# Patient Record
Sex: Female | Born: 1975 | Race: White | Hispanic: No | Smoking: Current every day smoker
Health system: Southern US, Community
[De-identification: ages and names within clinical notes are randomized; demographics above are authoritative.]

## PROBLEM LIST (undated history)

## (undated) DIAGNOSIS — K589 Irritable bowel syndrome without diarrhea: Secondary | ICD-10-CM

## (undated) HISTORY — PX: CHOLECYSTECTOMY: SHX55

---
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
## 1998-08-01 ENCOUNTER — Emergency Department (HOSPITAL_COMMUNITY): Admission: EM | Admit: 1998-08-01 | Discharge: 1998-08-01 | Payer: Self-pay | Admitting: Emergency Medicine

## 1999-10-18 ENCOUNTER — Other Ambulatory Visit: Admission: RE | Admit: 1999-10-18 | Discharge: 1999-10-18 | Payer: Self-pay | Admitting: Obstetrics and Gynecology

## 1999-10-23 ENCOUNTER — Emergency Department (HOSPITAL_COMMUNITY): Admission: EM | Admit: 1999-10-23 | Discharge: 1999-10-23 | Payer: Self-pay | Admitting: Emergency Medicine

## 1999-11-17 ENCOUNTER — Encounter: Payer: Self-pay | Admitting: Obstetrics and Gynecology

## 1999-11-17 ENCOUNTER — Ambulatory Visit (HOSPITAL_COMMUNITY): Admission: RE | Admit: 1999-11-17 | Discharge: 1999-11-17 | Payer: Self-pay | Admitting: Obstetrics and Gynecology

## 1999-12-15 ENCOUNTER — Encounter: Payer: Self-pay | Admitting: Obstetrics and Gynecology

## 1999-12-15 ENCOUNTER — Ambulatory Visit (HOSPITAL_COMMUNITY): Admission: RE | Admit: 1999-12-15 | Discharge: 1999-12-15 | Payer: Self-pay | Admitting: Obstetrics and Gynecology

## 2000-02-09 ENCOUNTER — Encounter: Admission: RE | Admit: 2000-02-09 | Discharge: 2000-05-09 | Payer: Self-pay | Admitting: Gynecology

## 2000-02-18 ENCOUNTER — Inpatient Hospital Stay (HOSPITAL_COMMUNITY): Admission: AD | Admit: 2000-02-18 | Discharge: 2000-02-18 | Payer: Self-pay | Admitting: Obstetrics and Gynecology

## 2000-02-29 ENCOUNTER — Ambulatory Visit (HOSPITAL_COMMUNITY): Admission: RE | Admit: 2000-02-29 | Discharge: 2000-02-29 | Payer: Self-pay | Admitting: Obstetrics and Gynecology

## 2000-02-29 ENCOUNTER — Encounter: Payer: Self-pay | Admitting: Obstetrics and Gynecology

## 2000-03-08 ENCOUNTER — Inpatient Hospital Stay (HOSPITAL_COMMUNITY): Admission: AD | Admit: 2000-03-08 | Discharge: 2000-03-08 | Payer: Self-pay | Admitting: Obstetrics and Gynecology

## 2000-03-29 ENCOUNTER — Inpatient Hospital Stay (HOSPITAL_COMMUNITY): Admission: AD | Admit: 2000-03-29 | Discharge: 2000-04-02 | Payer: Self-pay | Admitting: Obstetrics and Gynecology

## 2000-03-29 ENCOUNTER — Encounter: Payer: Self-pay | Admitting: Obstetrics and Gynecology

## 2000-03-29 ENCOUNTER — Ambulatory Visit (HOSPITAL_COMMUNITY): Admission: RE | Admit: 2000-03-29 | Discharge: 2000-03-29 | Payer: Self-pay | Admitting: Obstetrics and Gynecology

## 2001-02-26 ENCOUNTER — Other Ambulatory Visit: Admission: RE | Admit: 2001-02-26 | Discharge: 2001-02-26 | Payer: Self-pay | Admitting: Obstetrics and Gynecology

## 2002-01-22 ENCOUNTER — Emergency Department (HOSPITAL_COMMUNITY): Admission: EM | Admit: 2002-01-22 | Discharge: 2002-01-22 | Payer: Self-pay | Admitting: Emergency Medicine

## 2002-04-30 ENCOUNTER — Ambulatory Visit (HOSPITAL_COMMUNITY): Admission: RE | Admit: 2002-04-30 | Discharge: 2002-04-30 | Payer: Self-pay | Admitting: Internal Medicine

## 2002-04-30 ENCOUNTER — Encounter: Payer: Self-pay | Admitting: Internal Medicine

## 2003-03-09 ENCOUNTER — Other Ambulatory Visit: Admission: RE | Admit: 2003-03-09 | Discharge: 2003-03-09 | Payer: Self-pay | Admitting: Obstetrics and Gynecology

## 2004-04-25 ENCOUNTER — Emergency Department (HOSPITAL_COMMUNITY): Admission: EM | Admit: 2004-04-25 | Discharge: 2004-04-25 | Payer: Self-pay | Admitting: Emergency Medicine

## 2004-04-26 ENCOUNTER — Other Ambulatory Visit: Admission: RE | Admit: 2004-04-26 | Discharge: 2004-04-26 | Payer: Self-pay | Admitting: Obstetrics and Gynecology

## 2004-08-24 ENCOUNTER — Encounter: Admission: RE | Admit: 2004-08-24 | Discharge: 2004-08-24 | Payer: Self-pay | Admitting: Family Medicine

## 2004-09-07 ENCOUNTER — Encounter: Admission: RE | Admit: 2004-09-07 | Discharge: 2004-09-07 | Payer: Self-pay | Admitting: Family Medicine

## 2004-10-31 ENCOUNTER — Other Ambulatory Visit: Admission: RE | Admit: 2004-10-31 | Discharge: 2004-10-31 | Payer: Self-pay | Admitting: Obstetrics and Gynecology

## 2004-12-05 ENCOUNTER — Encounter: Admission: RE | Admit: 2004-12-05 | Discharge: 2005-03-05 | Payer: Self-pay | Admitting: Obstetrics and Gynecology

## 2004-12-11 ENCOUNTER — Ambulatory Visit (HOSPITAL_COMMUNITY): Admission: RE | Admit: 2004-12-11 | Discharge: 2004-12-11 | Payer: Self-pay | Admitting: Obstetrics and Gynecology

## 2005-01-29 ENCOUNTER — Inpatient Hospital Stay (HOSPITAL_COMMUNITY): Admission: AD | Admit: 2005-01-29 | Discharge: 2005-01-29 | Payer: Self-pay | Admitting: Obstetrics and Gynecology

## 2005-04-08 ENCOUNTER — Inpatient Hospital Stay (HOSPITAL_COMMUNITY): Admission: AD | Admit: 2005-04-08 | Discharge: 2005-04-11 | Payer: Self-pay | Admitting: Obstetrics and Gynecology

## 2005-04-08 ENCOUNTER — Encounter (INDEPENDENT_AMBULATORY_CARE_PROVIDER_SITE_OTHER): Payer: Self-pay | Admitting: Specialist

## 2005-04-12 ENCOUNTER — Encounter: Admission: RE | Admit: 2005-04-12 | Discharge: 2005-05-11 | Payer: Self-pay | Admitting: Obstetrics and Gynecology

## 2005-05-12 ENCOUNTER — Encounter: Admission: RE | Admit: 2005-05-12 | Discharge: 2005-06-11 | Payer: Self-pay | Admitting: Obstetrics and Gynecology

## 2005-06-11 ENCOUNTER — Ambulatory Visit (HOSPITAL_COMMUNITY): Admission: RE | Admit: 2005-06-11 | Discharge: 2005-06-11 | Payer: Self-pay | Admitting: Obstetrics and Gynecology

## 2005-06-12 ENCOUNTER — Encounter: Admission: RE | Admit: 2005-06-12 | Discharge: 2005-07-11 | Payer: Self-pay | Admitting: Obstetrics and Gynecology

## 2005-07-12 ENCOUNTER — Encounter: Admission: RE | Admit: 2005-07-12 | Discharge: 2005-08-11 | Payer: Self-pay | Admitting: Obstetrics and Gynecology

## 2005-08-12 ENCOUNTER — Encounter: Admission: RE | Admit: 2005-08-12 | Discharge: 2005-09-11 | Payer: Self-pay | Admitting: Obstetrics and Gynecology

## 2005-09-12 ENCOUNTER — Encounter: Admission: RE | Admit: 2005-09-12 | Discharge: 2005-10-09 | Payer: Self-pay | Admitting: Obstetrics and Gynecology

## 2005-10-10 ENCOUNTER — Encounter: Admission: RE | Admit: 2005-10-10 | Discharge: 2005-11-09 | Payer: Self-pay | Admitting: Obstetrics and Gynecology

## 2005-10-31 ENCOUNTER — Ambulatory Visit: Payer: Self-pay | Admitting: *Deleted

## 2005-10-31 ENCOUNTER — Ambulatory Visit: Payer: Self-pay | Admitting: Family Medicine

## 2005-11-09 ENCOUNTER — Ambulatory Visit: Payer: Self-pay | Admitting: Family Medicine

## 2005-11-10 ENCOUNTER — Encounter: Admission: RE | Admit: 2005-11-10 | Discharge: 2005-12-09 | Payer: Self-pay | Admitting: Obstetrics and Gynecology

## 2005-11-30 ENCOUNTER — Ambulatory Visit: Payer: Self-pay | Admitting: Family Medicine

## 2005-12-10 ENCOUNTER — Encounter: Admission: RE | Admit: 2005-12-10 | Discharge: 2006-01-09 | Payer: Self-pay | Admitting: Obstetrics and Gynecology

## 2006-01-10 ENCOUNTER — Encounter: Admission: RE | Admit: 2006-01-10 | Discharge: 2006-02-08 | Payer: Self-pay | Admitting: Obstetrics and Gynecology

## 2006-02-09 ENCOUNTER — Encounter: Admission: RE | Admit: 2006-02-09 | Discharge: 2006-03-11 | Payer: Self-pay | Admitting: Obstetrics and Gynecology

## 2006-02-12 ENCOUNTER — Ambulatory Visit: Payer: Self-pay | Admitting: Family Medicine

## 2006-03-12 ENCOUNTER — Encounter: Admission: RE | Admit: 2006-03-12 | Discharge: 2006-04-11 | Payer: Self-pay | Admitting: Obstetrics and Gynecology

## 2006-03-13 ENCOUNTER — Ambulatory Visit: Payer: Self-pay | Admitting: Nurse Practitioner

## 2006-04-12 ENCOUNTER — Encounter: Admission: RE | Admit: 2006-04-12 | Discharge: 2006-05-11 | Payer: Self-pay | Admitting: Obstetrics and Gynecology

## 2006-05-12 ENCOUNTER — Encounter: Admission: RE | Admit: 2006-05-12 | Discharge: 2006-06-11 | Payer: Self-pay | Admitting: Obstetrics and Gynecology

## 2006-06-12 ENCOUNTER — Encounter: Admission: RE | Admit: 2006-06-12 | Discharge: 2006-07-11 | Payer: Self-pay | Admitting: Obstetrics and Gynecology

## 2006-07-12 ENCOUNTER — Encounter: Admission: RE | Admit: 2006-07-12 | Discharge: 2006-08-11 | Payer: Self-pay | Admitting: Obstetrics and Gynecology

## 2006-08-12 ENCOUNTER — Encounter: Admission: RE | Admit: 2006-08-12 | Discharge: 2006-09-11 | Payer: Self-pay | Admitting: Obstetrics and Gynecology

## 2006-09-12 ENCOUNTER — Encounter: Admission: RE | Admit: 2006-09-12 | Discharge: 2006-10-11 | Payer: Self-pay | Admitting: Obstetrics and Gynecology

## 2007-01-28 ENCOUNTER — Other Ambulatory Visit: Payer: Self-pay

## 2007-01-28 ENCOUNTER — Other Ambulatory Visit: Payer: Self-pay | Admitting: Emergency Medicine

## 2007-01-28 ENCOUNTER — Inpatient Hospital Stay (HOSPITAL_COMMUNITY): Admission: AD | Admit: 2007-01-28 | Discharge: 2007-02-02 | Payer: Self-pay | Admitting: *Deleted

## 2007-01-28 ENCOUNTER — Ambulatory Visit: Payer: Self-pay | Admitting: *Deleted

## 2007-03-04 ENCOUNTER — Emergency Department (HOSPITAL_COMMUNITY): Admission: EM | Admit: 2007-03-04 | Discharge: 2007-03-04 | Payer: Self-pay | Admitting: Emergency Medicine

## 2007-11-22 ENCOUNTER — Inpatient Hospital Stay (HOSPITAL_COMMUNITY): Admission: AD | Admit: 2007-11-22 | Discharge: 2007-11-27 | Payer: Self-pay | Admitting: Psychiatry

## 2007-11-22 ENCOUNTER — Ambulatory Visit: Payer: Self-pay | Admitting: Psychiatry

## 2008-05-18 ENCOUNTER — Emergency Department (HOSPITAL_COMMUNITY): Admission: EM | Admit: 2008-05-18 | Discharge: 2008-05-18 | Payer: Self-pay | Admitting: Emergency Medicine

## 2008-06-13 ENCOUNTER — Emergency Department (HOSPITAL_COMMUNITY): Admission: EM | Admit: 2008-06-13 | Discharge: 2008-06-13 | Payer: Self-pay | Admitting: Emergency Medicine

## 2009-11-22 ENCOUNTER — Emergency Department (HOSPITAL_COMMUNITY): Admission: EM | Admit: 2009-11-22 | Discharge: 2009-11-22 | Payer: Self-pay | Admitting: Emergency Medicine

## 2010-05-14 ENCOUNTER — Emergency Department (HOSPITAL_COMMUNITY)
Admission: EM | Admit: 2010-05-14 | Discharge: 2010-05-15 | Payer: Self-pay | Source: Home / Self Care | Admitting: Emergency Medicine

## 2010-05-23 ENCOUNTER — Emergency Department (HOSPITAL_COMMUNITY): Admission: EM | Admit: 2010-05-23 | Discharge: 2010-05-23 | Payer: Self-pay | Admitting: Emergency Medicine

## 2010-08-13 ENCOUNTER — Encounter: Payer: Self-pay | Admitting: Obstetrics and Gynecology

## 2010-08-21 ENCOUNTER — Emergency Department (HOSPITAL_COMMUNITY)
Admission: EM | Admit: 2010-08-21 | Discharge: 2010-08-21 | Payer: Self-pay | Source: Home / Self Care | Admitting: Emergency Medicine

## 2010-08-25 ENCOUNTER — Emergency Department (HOSPITAL_COMMUNITY)
Admission: EM | Admit: 2010-08-25 | Discharge: 2010-08-25 | Disposition: A | Discharge status: Home / Self Care | Payer: BC Managed Care – PPO | Attending: Emergency Medicine | Admitting: Emergency Medicine

## 2010-08-25 DIAGNOSIS — G8929 Other chronic pain: Secondary | ICD-10-CM | POA: Insufficient documentation

## 2010-08-25 DIAGNOSIS — K589 Irritable bowel syndrome without diarrhea: Secondary | ICD-10-CM | POA: Insufficient documentation

## 2010-08-25 DIAGNOSIS — E669 Obesity, unspecified: Secondary | ICD-10-CM | POA: Insufficient documentation

## 2010-08-25 DIAGNOSIS — R1013 Epigastric pain: Secondary | ICD-10-CM | POA: Insufficient documentation

## 2010-08-25 DIAGNOSIS — J45909 Unspecified asthma, uncomplicated: Secondary | ICD-10-CM | POA: Insufficient documentation

## 2010-08-25 DIAGNOSIS — K625 Hemorrhage of anus and rectum: Secondary | ICD-10-CM | POA: Insufficient documentation

## 2010-08-25 DIAGNOSIS — M549 Dorsalgia, unspecified: Secondary | ICD-10-CM | POA: Insufficient documentation

## 2010-08-25 DIAGNOSIS — K921 Melena: Secondary | ICD-10-CM | POA: Insufficient documentation

## 2010-08-25 LAB — BASIC METABOLIC PANEL
CO2: 25 mEq/L (ref 19–32)
Chloride: 105 mEq/L (ref 96–112)
Potassium: 3.6 mEq/L (ref 3.5–5.1)

## 2010-08-25 LAB — POCT I-STAT, CHEM 8
BUN: 8 mg/dL (ref 6–23)
Chloride: 103 mEq/L (ref 96–112)
Hemoglobin: 15.6 g/dL — ABNORMAL HIGH (ref 12.0–15.0)
Sodium: 139 mEq/L (ref 135–145)

## 2010-09-14 ENCOUNTER — Other Ambulatory Visit: Payer: Self-pay | Admitting: Gastroenterology

## 2010-09-15 ENCOUNTER — Other Ambulatory Visit: Payer: Self-pay | Admitting: Gastroenterology

## 2010-09-19 ENCOUNTER — Ambulatory Visit
Admission: RE | Admit: 2010-09-19 | Discharge: 2010-09-19 | Disposition: A | Discharge status: Home / Self Care | Payer: BC Managed Care – PPO | Source: Ambulatory Visit | Attending: Gastroenterology | Admitting: Gastroenterology
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
## 2010-12-08 NOTE — Discharge Summary (Signed)
NAMECORTNE, [name]                 ACCOUNT NO.:  [id number]   MEDICAL RECORD NO.:  [id number]          PATIENT TYPE:  INP   LOCATION:  9129                          FACILITY:  [facility]   PHYSICIAN:  Zenaida Niece, M.D.DATE OF BIRTH:  12-18-1975   DATE OF ADMISSION:  04/08/2005  DATE OF DISCHARGE:  04/11/2005                                 DISCHARGE SUMMARY   ADMISSION DIAGNOSES:  1.  Intrauterine pregnancy at 36+ weeks.  2.  Previous cesarean section x2.  3.  Acute vaginal bleeding.  4.  Class A1 gestational diabetes.   DISCHARGE DIAGNOSIS:  1.  Intrauterine pregnancy at 36+ weeks.  2.  Previous cesarean section x2.  3.  Acute vaginal bleeding.  4.  Class A1 gestational diabetes.   PROCEDURES:  On April 08, 2005, she had a stat repeat low transverse  cesarean section.   HISTORY AND PHYSICAL:  This is a 36 year old white female gravida 3, para 2-  0-0-2 with an EGA of [redacted] weeks who presents with complaint of brisk vaginal  bleeding and abdominal pain with contractions. Evaluation in triage revealed  a fetal heart tracing in the 90s to 100s with vaginal bleeding and she was  taken to the operating for a stat cesarean section. Prenatal care  complicated by class A1 gestational diabetes with good control with diet,  asthma well controlled with medications, epigastric pain at 27 weeks  diagnosed with cholelithiasis. She was scheduled for an amniocentesis and  repeat cesarean section next week.   PRENATAL LABS:  Blood type is AB positive with a negative antibody screen,  RPR nonreactive, rubella immune, hepatitis B surface antigen negative, HIV  negative, gonorrhea and chlamydia negative. A 1-hour Glucola 159. A 3-hour  GTT 95, 186, 166, 81. A group B strep is positive.   PAST OBSTETRICAL HISTORY:  In 1996 cesarean section at 40 weeks, 7 pounds 13  ounces in 2001. Repeat cesarean section at 40 weeks, 8 pounds 5 ounces  complicated by gestational diabetes.   PAST MEDICAL  HISTORY:  Asthma and irritable bowel syndrome.   MEDICATIONS:  Singulair and Advair.   PHYSICAL EXAM:  She is afebrile with stable vital signs. Fetal heart tracing  is in the 90s to 100s. Abdomen is gravid and tender. Vaginal exam reveals  gross blood and cervical exam is deferred.   HOSPITAL COURSE:  The patient was admitted and taken to the operating room  for stat cesarean section. This is a repeat low transverse cesarean section  done under general anesthesia. She delivered a viable female infant with  Apgar's of 4, 8 and 10. Weight 5 pounds 12 ounces and had an arterial cord  pH of 7.01. Estimated blood loss was 1000 cc and there was approximately 200  cc of clot in the uterus consistent with a possible abruption.  Postoperatively, she had no significant complications. Predelivery  hemoglobin 12.5 and postdelivery was 8.4. She did have a sore throat with a  negative throat swab for group A strep. On postoperative day #3, she was  felt to be stable enough for discharge home.  DISCHARGE INSTRUCTIONS:  Regular diet, pelvic rest and no strenuous  activity. Follow-up is in approximately 2 days for staple removal.   MEDICATIONS:  Percocet #30 one to two p.o. every 4-6 hours p.r.n. pain and  she is given our discharge pamphlet.      Zenaida Niece, M.D.  Electronically Signed     TDM/MEDQ  D:  05/12/2005  T:  05/12/2005  Job:  [id number]

## 2010-12-08 NOTE — H&P (Signed)
[name], [name]                 ACCOUNT NO.:  [id number]   MEDICAL RECORD NO.:  [id number]          PATIENT TYPE:  AMB   LOCATION:  SDC                           [facility]ILITY:  [facility]   PHYSICIAN:  Zenaida Niece, M.D.DATE OF BIRTH:  1976/01/09   DATE OF ADMISSION:  06/11/2005  DATE OF DISCHARGE:                                HISTORY & PHYSICAL   CHIEF COMPLAINT:  Desires surgical sterility.   HISTORY OF PRESENT ILLNESS:  This is a 35 year old white female, para 3-0-0-  3, who is status post a stat repeat cesarean section on April 08, 2005  who desires surgical sterility.  All options for contraception have been  discussed with her, and she wishes to proceed with surgical therapy for  sterility.   PAST OBSTETRICAL HISTORY:  Significant for 3 cesarean sections, again the  last one being on April 08, 2005 at 36 weeks that was a stat cesarean  section for an abruption.  She does have a history of gestational diabetes  with 2 of those pregnancies.   PAST MEDICAL HISTORY:  1.  Asthma.  2.  Irritable bowel syndrome.  3.  Remote history of pyelonephritis.  4.  Anxiety and depression.   PAST SURGICAL HISTORY:  Cesarean section x3.   SOCIAL HISTORY:  The patient is married, and she does smoke about a half  pack of cigarettes a day.   ALLERGIES:  None known.   CURRENT MEDICATIONS:  1.  Lexapro for anxiety and depression.  2.  Singulair and Advair for her asthma.   REVIEW OF SYSTEMS:  Otherwise negative.   PHYSICAL EXAMINATION:  VITAL SIGNS:  Weight is approximately 240 pounds.  The last blood pressure was 130/68.  GENERAL:  This is a slightly obese white female in no acute distress.  NECK:  Supple without lymphadenopathy or thyromegaly.  LUNGS:  Clear to auscultation.  HEART:  Regular rate and rhythm without murmur.  ABDOMEN:  Obese but soft and nontender, without palpable masses, and with a  well-healed transverse scar.  EXTREMITIES:  No significant edema and are  nontender.  PELVIC:  No lesions.  On speculum exam, the cervix was normal.  On bimanual  exam, I am unable to palpate her uterus due to her body habitus, but there  are no palpable masses.   ASSESSMENT:  Desires surgical sterility.  All non-surgical and surgical  options of birth control have been discussed with the patient.  She does not  want any more children.  She understands that surgical sterility is  permanent with a 1 in 150 failure rate.  She understands the risks of  surgery.   PLAN:  The plan is to admit the patient for hysteroscopy with Essure  sterilization.  She does want to undergo laparoscopy with tubal fulguration  if we are unable to place coils in both tubes.      Zenaida Niece, M.D.  Electronically Signed     TDM/MEDQ  D:  06/10/2005  T:  06/11/2005  Job:  [id number]

## 2010-12-08 NOTE — Discharge Summary (Signed)
[facility]  Patient:    [name], [name]                        MRN: [id number] Adm. Date:  [id number] Disc. Date: [id number] Attending:  Michaele Offer                           Discharge Summary  HISTORY OF PRESENT ILLNESS:   This is a 35 year old white female, para 1-0-0-1, gravida 2, estimated gestational age 45+ weeks by 12-week ultrasound with [facility] April 19, 2000, presented for repeat C section due to significant back pain as well as mature fetal lung maturity stage by amniocentesis.  The patient had previous C section, was cleared for trial of labor.  This was going to be done; however, she measured significantly greater than dates, and she had suspected fetal macrosomia, and, thus, we elected to proceed with repeat C section.  She also had class A1 gestational diabetes with intermittently elevated blood sugars but no consistent pattern requiring insulin.  The patient was exposed to chicken pox during her pregnancy and was found to have immune varicella zoster IgG.  She had an ultrasound on August 9 with size greater than dates, and at that time, the estimated fetal weight was approximately 2700 g, and her AFI was increased at 25.6.  The amniocentesis on the morning of delivery showed an FLM of 93 and an AFI of 30 on ultrasound.  PRENATAL LABORATORY DATA:     Prenatal lab work showed a blood group and type of AB positive with a negative antibody, RPR nonreactive, rubella equivocal, hepatitis B surface antigen negative, HIV negative, GC and chlamydia negative, triple screen normal.  Initial one-hour glucola was 115; at 28 weeks, it was 231.  Three-hour test was not performed.  Group B strep was positive.  PAST OBSTETRICAL HISTORY:     The patient had a 2-D echocardiogram low transverse cervical cesarean section in 1996 in [place] done for fetal stress.  PAST MEDICAL HISTORY:         Significant for pyelonephritis  requiring hospitalization approximately 11 years ago.  PAST SURGICAL HISTORY:        Only C section.  SOCIAL HISTORY:               The patient is single, but the father of the baby is involved.  The patient smoked approximately one-half pack of cigarettes a day prior to pregnancy, but she did quit during the pregnancy.  PHYSICAL EXAMINATION:  VITAL SIGNS:                  On admission, the patient weighed 269 pounds, blood pressure 148/70.  ABDOMEN:                      Soft, gravid, fundal height 47 cm.  EXTREMITIES:                  DTRs 2+.  HOSPITAL COURSE:  The patient was 37 weeks with documented lung maturity.  The patient had significant back pain which was making the pregnancy unbearable. She also had a history of previous C section and was cleared for a trial of labor, but due to a suspected large infant, Dr. Jackelyn Knife elected to proceed with repeat C section.  The patient also requested tubal ligation.  The patient underwent a low transverse cervical cesarean section by  Dr. Jackelyn Knife.  After the C section was done, the patient was asked again about her desire for tubal ligation, and she declined to have it done. Therefore, the abdomen was closed.  The patient progressed well in her postpartum course and was discharged on the fourth postpartum day.  Staples were removed, strips were applied on the fourth postpartum day.  The mid part of the incision was a little moist but appeared to be healing well.   The incision was right in the fold of tissue.  Hemoglobin 12.4, hematocrit 37.5, white count 11,600, platelet count 228,00.  FINAL DIAGNOSES:              1. Intrauterine pregnancy at 37 weeks.                               2. Delivered by repeat cesarean section.                               3. Back pain.                               4. Gestational diabetes.                               5. Previous cesarean section.                               6.  Polyhydramnios.  OPERATION:                    Low transverse cervical cesarean section.  FINAL CONDITION:              Improved.  DISCHARGE INSTRUCTIONS:       Include our regular discharge instructionbooklet.  She is given a prescription for ibuprofen 800 mg, 20 tablets,  every 8 hours as needed for pain and Percocet 5/325, 16 tablets, 1 every 4-6h. as needed for pain.  She was advised to return to the office in two weeks for followup examination.DD:  04/02/00 TD:  04/03/00 Job: [id number]

## 2010-12-08 NOTE — H&P (Signed)
[facility]  Patient:    [name], [name]                          MRN: [id number] Adm. Date:  [id number] Disc. Date: [id number] Attending:  Oliver Pila                         History and Physical  CHIEF COMPLAINT:              Back pain.  HISTORY OF PRESENT ILLNESS:   This patient is a 35 year old white female, gravida 2 para 1, 0-0-1, with an EGA of 37+ weeks by a 12 week ultrasound, with an EDC of April 19, 2000, who presents for repeat cesarean section due to significant back pain as well as mature fetal lung maturity study by amniocentesis.  The patient has had previous cesarean section and was cleared for a trial labor, and this was going to be done for delivery; however, she measures significantly size greater than dates so she has suspected fetal macrosomia and thus we have elected to proceed with repeat cesarean section. She has also had class A1 gestational diabetes with intermittently elevated blood sugars, but no consistent pattern requiring insulin.  The patient was also exposed to chicken pox during her pregnancy and was found to have immune varicella zoster IgG.  She had an ultrasound on February 29, 2000 for size greater than dates and at that time estimated fetal weight was approximately 2700 g, and her AFI was increased at 25.6.  On the morning of delivery she underwent an amniocentesis to document fetal lung maturity due to her significant back pain and this revealed an FLM of 93 and also significant polyhydramnios with an AFI of 30.  PRENATAL LABORATORY DATA:     Blood type is AB-positive with negative antibody screen.  RPR nonreactive.  Rubella equivocal.  Hepatitis B surface antigen negative.  HIV negative.  Gonorrhea and Chlamydia negative.  Triple screen was normal.  Initial one hour glucola was 115.  This was repeated at 28 weeks and found to be 231, and a three hour test was not performed.  Group B Strep  was positive.  PAST OBSTETRICAL HISTORY:     In 1996 she had a low transverse cesarean section in [place], [place] by Dr. Emelda Fear.  This was done for fetal stress and she was cleared for a trial of labor.  That pregnancy was complicated by pregnancy induced hypertension and anemia.  PAST MEDICAL HISTORY:         Significant for pyelonephritis requiring hospitalization approximately 11 years ago.  PAST SURGICAL HISTORY:        Significant only for the cesarean section.  SOCIAL HISTORY:               The patient is single but the father of the baby is involved.  The patient smoked approximately one-half pack of cigarettes a day, although she did quit during pregnancy.  REVIEW OF SYSTEMS:            Otherwise negative.  FAMILY HISTORY:               Noncontributory.  PHYSICAL EXAMINATION:  GENERAL:                      She is an obese white female, in no acute distress.  VITAL SIGNS:  Weight 269 pounds.  Last blood pressure in the office was 148/70 and she had 3+ proteinuria.  HEENT:                        PERRLA.  EOMI.  Oropharynx clear without erythema or exudate.  NECK:                         Supple, without lymphadenopathy or thyromegaly.  LUNGS:                        Clear to auscultation.  HEART:                        Regular rate and rhythm without murmur.  ABDOMEN:                      Largely gravid with fundal height of 47 cm. Well-healed low transverse scar.  EXTREMITIES:                  Edema 3+, nontender.  DTRs 2/4 and symmetric without clonus.  PELVIC:                       Vaginal examination shows the cervix is closed, 50% effaced, and high.  ASSESSMENT:                   Intrauterine pregnancy at 37 weeks with documented fetal lung maturity.  The patient has significant back pain which is making the pregnancy unbearable.  She also has a history of previous cesarean section and is cleared for a trial of labor, but due to  a suspected large infant we have elected to proceed with repeat cesarean section.  She has class A1 gestational diabetes which has been fairly well controlled with diet and polyhydramnios.  We did discuss having her tubes tied.  The patient prior to surgery desires tubal ligation.  PLAN:                         Admit the patient for repeat cesarean section and possible tubal ligation. DD:  03/29/00 TD:  03/30/00 Job: [id number]

## 2010-12-08 NOTE — Op Note (Signed)
NAMELAVILLA, [name]                 ACCOUNT NO.:  [id number]   MEDICAL RECORD NO.:  [id number]          PATIENT TYPE:  AMB   LOCATION:  SDC                           FACILITY:  [facility]   PHYSICIAN:  Zenaida Niece, M.D.DATE OF BIRTH:  [birth year]   DATE OF PROCEDURE:  06/11/2005  DATE OF DISCHARGE:                                 OPERATIVE REPORT   PREOPERATIVE DIAGNOSIS:  Desires surgical sterility.   POSTOPERATIVE DIAGNOSIS:  Desires surgical sterility.  Intrauterine  adhesions.   PROCEDURES:  Essure sterilization.   SURGEON:  Zenaida Niece, M.D.   ANESTHESIA:  Is initially IV sedation and paracervical block followed by  general with an LMA.   SPECIMENS:  None.   ESTIMATED BLOOD LOSS:  Minimal.   COMPLICATIONS:  None.   FINDINGS:  She had several adhesions in the endometrial cavity making  especially her right tube slightly difficult to visualize. Once the Essure  coils were placed, there were two coils visible on the right side and three  to four coils visible on the left side.   PROCEDURE IN DETAIL:  The patient was taken to the operating room and placed  in the dorsosupine position. She was given IV sedation and placed in mobile  stirrups. Perineum and vagina were prepped and draped in the usual sterile  fashion and bladder drained with a red rubber catheter. A Graves speculum  was inserted into the vagina and the anterior lip of the cervix was grasped  with a single-tooth tenaculum. Paracervical block was then performed with 16  mL of 2% lidocaine. Cervix was gradually dilated to a size 25 dilator and  the observer hysteroscope was inserted. We initially had difficulty getting  adequate pressure but eventually this was achieved. It was slightly  difficult to find both tubes especially her right tube due to some  intrauterine adhesions. There was some adhesion right around the entrance to  the right fallopian tube so we proceeded with this one first. One coil was  placed and it did not deploy appropriately so it was removed. The scope was  then reinserted and another coil was placed into the right tube and deployed  appropriately. Two coils visualized on that side. We then proceeded to the  left side. The left side was easier to visualize. The coil was placed and  appropriately deployed and three to four coils were seen on the patient's  left side. The hysteroscope was then removed. The single-tooth tenaculum was  removed from the cervix and  bleeding controlled with pressure. All instruments were then removed from  the vagina. The patient tolerated the procedure well and was taken to the  recovery room in stable condition. Counts were correct. Heather Honeycutt of  [employer] was present at the procedure.      Zenaida Niece, M.D.  Electronically Signed     TDM/MEDQ  D:  06/11/2005  T:  06/11/2005  Job:  [id number]

## 2010-12-08 NOTE — Op Note (Signed)
[facility]  Patient:    [name]                        MRN: [id number] Proc. Date: 03/29/00 Adm. Date:  [id number] Attending:  Michaele Offer                           Operative Report  PREOPERATIVE DIAGNOSES:       Intrauterine pregnancy at 37 weeks, mature fetal lung studies, significant back pain, previous cesarean section, gestational diabetes, polyhydramnios, desired surgical sterility.  POSTOPERATIVE DIAGNOSIS:      Intrauterine pregnancy at 37 weeks, mature fetal lung studies, significant back pain, previous cesarean section, gestational diabetes, polyhydramnios.  PROCEDURE:                    Repeat low transverse cesarean section with extensions.  SURGEON:                      Zenaida Niece, M.D.  ANESTHESIA:                   Spinal.  ESTIMATED BLOOD LOSS:         1000 cc.                                ______ prophylaxis Ancef 1 g after cord clamp.  FINDINGS:                     Normal anatomy and delivery of a viable female infant with Apgars of 9 and 9, weight 8 pounds 5 ounces.  COUNTS:                       Correct.  CONDITION:                    Stable.  PROCEDURE IN DETAIL:          After appropriate and informed consent was obtained the patient was taken to the operating room and placed in the sitting position.  Spinal anesthesia was instilled by Dr. ______ and she was placed in the dorsal supine position with left lateral tilt.  Her abdomen was then prepped and draped in the usual sterile fashion and a Foley catheter inserted. The level of her anesthesia was found to be adequate and her abdomen was entered via her previous Pfannenstiel incision.  The superior fascia was grasped with Kocher clamps and via a lap pad was connected to the ______ screen to create a sling to support the fascia.  The vesicouterine peritoneum was incised and the bladder flap created digitally.  The bladder blade was then  placed.  A transverse incision was made in the lower uterine segment and extended digitally.  Clear amniotic fluid was noted upon ruptured membranes. The fetus was delivered through the incision atraumatically.  The mouth and nares were suctioned.  A loose nuchal cord reduced.  Once the baby was delivered the cord was doubly clamped and cut and the infant handed to the awaiting pediatric team.  Cord blood and the segment of cord for gas were obtained.  The placenta then delivered spontaneously without difficulty.  The cervix was dilated with a long ring forceps.  The uterine incision was inspected and found to be free of  extensions.  ______ closed in one layer being a running locking layer with 1 Chromic.  Bleeding from the middle of the incision was controlled with one figure-of-eight suture of 1 Chromic. Bleeding from several sledges was controlled with electrocautery.  This was done with the uterus exteriorized.  The uterus was then placed back into the abdomen and the uterine incision again inspected and found to be hemostatic. At this time the patient was asked if she still desired tubal ligation and she desired he did not want to proceed with tubal ligation.  She said she was not 100% sure was reassured this could be done at a later time if she desired. The pericolic gutters were blotted and the uterine incision again inspected and found to be hemostatic.  The rectus muscle was then reapproximated in the midline with two interrupted sutures of 1 Chromic due to significant prolapse of omentum and bladder.  A subfascial space was then irrigated and made hemostatic with electrocautery.  The fascia was closed in a running fashion starting at both ends and meeting in the middle with 0 Vicryl.  The subcutaneous tissue was irrigated and made hemostatic with electrocautery. The subcutaneous tissue was closed with a running suture of 3-0 Vicryl.  The skin was then closed with staples and  sterile dressing.  Patient tolerated the procedure well and was taken to the recovery room in stable condition. DD:  03/29/00 TD:  03/31/00 Job: [id number]

## 2010-12-08 NOTE — Op Note (Signed)
[name], [name]                 ACCOUNT NO.:  [id number]   MEDICAL RECORD NO.:  [id number]          PATIENT TYPE:  INP   LOCATION:  9129                          FACILITY:  [facility]   PHYSICIAN:  Leighton Roach Meisinger, M.D.DATE OF BIRTH:  [birth year]   DATE OF PROCEDURE:  04/08/2005  DATE OF DISCHARGE:                                 OPERATIVE REPORT   PREOPERATIVE DIAGNOSIS:  Intrauterine pregnancy at 36 weeks, previous  cesarean section x2, fetal bradycardia, vaginal bleeding, and gestational  diabetes.   POSTOPERATIVE DIAGNOSIS:  Intrauterine pregnancy at 36 weeks, previous  cesarean section x2, abruption, and gestational diabetes.   PROCEDURE:  Repeat low transverse cesarean section.   SURGEON:  Zenaida Niece, M.D.   ANESTHESIA:  General endotracheal tube.   SPECIMENS:  Placenta.   ESTIMATED BLOOD LOSS:  1000 mL with approximately 200 mL of clot in the  uterus.   COMPLICATIONS:  Approximately 200 mL of clot and the uterus.   FINDINGS:  Viable female with Apgars of 4, 8, and 10 that weighed 5 pounds  12 ounces and had an arterial cord pH of  7.01   PROCEDURE IN DETAIL:  The patient was taken to the operating room for a stat  cesarean section. She was placed in the dorsosupine position. The abdomen  was quickly prepped with Betadine and a Foley catheter inserted and a  sterile drape was placed. General anesthesia was induced and abdomen was  entered via standard Pfannenstiel's incision. Bladder blade was placed and  the 4 cm transverse incision was made in lower uterine segment. Once the  uterine cavity was entered, a small amount of clot delivered. Membranes were  ruptured with clear fluid. The incision was extended digitally. Fetal vertex  was grasped and delivered through the incision atraumatically. Mouth and  nares were suctioned. The remainder of the infant then delivered  atraumatically. Cord was doubly clamped and cut and the infant handed to the  awaiting  pediatric team. Cord blood and cord gas were obtained. Placenta  then delivered with approximately 200 mL of clot as well. Uterus was  exteriorized and uterine cavity was wiped dry with a clean lap pad. Uterus  clamped down well with Pitocin. Uterine incision was inspected and found to  be free of extensions. Uterus was then placed back into the abdominal  cavity. Uterine incision was closed in one layer being a running locking  layer with #1 chromic with adequate hemostasis. Paracolic gutters were  blotted and all clots and debris removed. Bleeding from serosal edges was  controlled with electrocautery. Omental adhesions to the anterior abdominal  wall were taken down with electrocautery to help control some bleeding.  Subfascial space was then inspected and made hemostatic with electrocautery.  Fascia was closed in running fashion starting at both ends and meeting in  the middle with 0 Vicryl. Tubes and ovaries had been inspected and were  normal. Subcutaneous tissue was then irrigated and made hemostatic with  electrocautery. Subcutaneous tissue was closed with running 2-0 plain  gut suture. Skin was then closed with staples and sterile  dressing. The  patient tolerated the procedure well.  She was extubated in the operating  room and taken to recovery in stable condition. Counts were correct x2, she  received Ancef  2 grams after cord clamp.      Zenaida Niece, M.D.  Electronically Signed     TDM/MEDQ  D:  04/08/2005  T:  04/08/2005  Job:  [id number]

## 2010-12-08 NOTE — Discharge Summary (Signed)
NAMEWILLEEN, [name] NO.:  [id number]   MEDICAL RECORD NO.:  [id number]          PATIENT TYPE:  IPS   LOCATION:  0507                          [facility]ACILITY:  [facility]   PHYSICIAN:  Geoffery Lyons, M.D.      DATE OF BIRTH:  [birth year]   DATE OF ADMISSION:  11/22/2007  DATE OF DISCHARGE:  11/27/2007                               DISCHARGE SUMMARY   CHIEF COMPLAINT:  This was the second admission to [facility] for this 35 year old divorced white female.  She had been married  and divorced twice.  Presented to the ED requesting detox from cocaine,  crack.  Started using 200 mg per day after 9 months of being abstinent.  Endorsed suicidal thoughts with plan to overdose on cocaine. The day  before this admission to the [facility] that she was  planning to drive her car over a cliff, but somehow that did not happen.  Endorsed some friends died recently. Girlfriend died a month prior to  this admission and her best friend killed himself 30 days prior to this  admission.  Endorsed that every day she woke up she had to make a  decision not to get high. Depressed since 2005 when her youngest child's  father went to prison.   PAST PSYCHIATRIC HISTORY:  In July for detox went to [facility], went to  [facility] afterwards for drug follow up to  be with her father.   SECONDARY HISTORY:  Using marijuana and recent relapse on cocaine. Began  using crack July 2008.   MEDICAL HISTORY:  Asthma.   MEDICATIONS:  None.   PHYSICAL EXAM:  Failed to show any acute findings.   LABORATORY WORK:  UDS positive for cocaine.   MENTAL HEALTH EXAMINATION:  Reveals alert cooperative female, casually  groomed and dressed. Speech was normal in rate, tempo and production.  Mood depressed, anxious.  Affect labile. Thought processes were clear,  rational, alert and oriented.  No active suicidal or homicidal ideas, no  delusions.  No hallucinations.   Cognition well-preserved.   ADMITTING DIAGNOSES:  AXIS I: Depressive disorder, not otherwise  specified.  Cocaine abuse, marijuana abuse.  AXIS II: No diagnosis.  AXIS III:  History asthma.  AXIS IV: Moderate.  AXIS V:  Upon admission 45, highest GAF in the last year 60.   COURSE IN THE HOSPITAL:  She was admitted.  She was started in the  individual and group psychotherapy.  She was taking Lexapro 20 mg per  day, Symmetrel 100 twice a day, albuterol inhaler and trazodone at  bedtime.  She was also given some Seroquel. As already stated, she left  in July 2008.  She went to [facility] for  3 weeks.  She was 8 months  abstinence and relapsed.  She was taking care of her father. Endorsed  that it was very stressful. Ex-husband, Rob, is back in jail.  She  endorsed that she needed to go back to a residential program as she felt  she was not going to make it. The  mental health was able to arrange for  her to go so on May 5 she was very encouraged that she was going to be  able to go to a program.  She was more hopeful.  Still dealing with the  shame and the guilt after the relapse.  She was somewhat labile with  episodes of anger and irritability. She was dealing with the fact that  she was seeing other patients with visitors and she had no one. Also  admits that she was wanting to smoke. By May 7, she was in full contact  reality.  She was going to pursue further treatment through [facility].  Encouraged and motivated.  We went ahead and discharged to outpatient  followup.   DISCHARGE DIAGNOSES:  AXIS I: Mood disorder NOS, cocaine, marijuana  dependence.  AXIS II: No diagnosis.  AXIS III:  Asthma.  AXIS IV: Moderate.  AXIS V:  On discharge 50.   Discharged on Symmetrel 100 twice a day, trazodone 100 at night, Celexa  20 mg per day, Seroquel 100 one twice a day and one at night, Protonix  40 mg twice a day.   FOLLOW UP:  [facility] after going to [place].      Geoffery Lyons, M.D.   Electronically Signed     IL/MEDQ  D:  12/23/2007  T:  12/23/2007  Job:  [id number]

## 2010-12-19 ENCOUNTER — Emergency Department (HOSPITAL_COMMUNITY)
Admission: EM | Admit: 2010-12-19 | Discharge: 2010-12-19 | Disposition: A | Discharge status: Home / Self Care | Payer: BC Managed Care – PPO | Attending: Emergency Medicine | Admitting: Emergency Medicine

## 2010-12-19 ENCOUNTER — Encounter (HOSPITAL_COMMUNITY): Payer: Self-pay | Admitting: Radiology

## 2010-12-19 ENCOUNTER — Emergency Department (HOSPITAL_COMMUNITY): Payer: BC Managed Care – PPO

## 2010-12-19 DIAGNOSIS — K8020 Calculus of gallbladder without cholecystitis without obstruction: Secondary | ICD-10-CM | POA: Insufficient documentation

## 2010-12-19 DIAGNOSIS — K589 Irritable bowel syndrome without diarrhea: Secondary | ICD-10-CM | POA: Insufficient documentation

## 2010-12-19 DIAGNOSIS — J45909 Unspecified asthma, uncomplicated: Secondary | ICD-10-CM | POA: Insufficient documentation

## 2010-12-19 DIAGNOSIS — F329 Major depressive disorder, single episode, unspecified: Secondary | ICD-10-CM | POA: Insufficient documentation

## 2010-12-19 DIAGNOSIS — F3289 Other specified depressive episodes: Secondary | ICD-10-CM | POA: Insufficient documentation

## 2010-12-19 LAB — COMPREHENSIVE METABOLIC PANEL
ALT: 20 U/L (ref 0–35)
AST: 16 U/L (ref 0–37)
Albumin: 4.1 g/dL (ref 3.5–5.2)
Alkaline Phosphatase: 95 U/L (ref 39–117)
BUN: 12 mg/dL (ref 6–23)
CO2: 28 mEq/L (ref 19–32)
Calcium: 10.3 mg/dL (ref 8.4–10.5)
Chloride: 99 mEq/L (ref 96–112)
Creatinine, Ser: 0.73 mg/dL (ref 0.4–1.2)
GFR calc Af Amer: >60 mL/min (ref >60)
GFR calc non Af Amer: >60 mL/min (ref >60)
Glucose, Bld: 98 mg/dL (ref 70–99)
Potassium: 3.9 mEq/L (ref 3.5–5.1)
Sodium: 136 mEq/L (ref 135–145)
Total Bilirubin: 0.2 mg/dL — ABNORMAL LOW (ref 0.3–1.2)
Total Protein: 7.6 g/dL (ref 6.0–8.3)

## 2010-12-19 LAB — DIFFERENTIAL
Basophils Absolute: 0.1 10*3/uL (ref 0.0–0.1)
Basophils Relative: 0 % (ref 0–1)
Lymphocytes Relative: 36 % (ref 12–46)
Lymphs Abs: 4.1 10*3/uL — ABNORMAL HIGH (ref 0.7–4.0)
Monocytes Absolute: 0.6 10*3/uL (ref 0.1–1.0)
Monocytes Relative: 6 % (ref 3–12)
Neutro Abs: 6.3 10*3/uL (ref 1.7–7.7)
Neutrophils Relative %: 56 % (ref 43–77)

## 2010-12-19 LAB — CBC
HCT: 44.7 % (ref 36.0–46.0)
Hemoglobin: 15.3 g/dL — ABNORMAL HIGH (ref 12.0–15.0)
MCH: 30.5 pg (ref 26.0–34.0)
MCHC: 34.2 g/dL (ref 30.0–36.0)
MCV: 89 fL (ref 78.0–100.0)
Platelets: 265 10*3/uL (ref 150–400)
RBC: 5.02 MIL/uL (ref 3.87–5.11)
RDW: 12.6 % (ref 11.5–15.5)
WBC: 11.3 10*3/uL — ABNORMAL HIGH (ref 4.0–10.5)

## 2010-12-19 LAB — PREGNANCY, URINE: Preg Test, Ur: NEGATIVE

## 2010-12-19 LAB — LIPASE, BLOOD: Lipase: 38 U/L (ref 11–59)

## 2010-12-19 MED ORDER — IOHEXOL 300 MG/ML  SOLN
120.0000 mL | Freq: Once | INTRAMUSCULAR | Status: AC | PRN
  Administered 2010-12-19: 120 mL via INTRAVENOUS

## 2010-12-22 ENCOUNTER — Encounter (HOSPITAL_COMMUNITY): Payer: BC Managed Care – PPO

## 2010-12-22 ENCOUNTER — Other Ambulatory Visit: Payer: Self-pay | Admitting: Anesthesiology

## 2010-12-22 LAB — SURGICAL PCR SCREEN

## 2010-12-25 NOTE — H&P (Signed)
  [name], [name] NO.:  [id number]  MEDICAL RECORD NO.:  [id number]           PATIENT TYPE:  LOCATION:                                 FACILITY:  PHYSICIAN:  Dalia Heading, M.D.  DATE OF BIRTH:  1975-11-22  DATE OF ADMISSION: DATE OF DISCHARGE:  LH                             HISTORY & PHYSICAL   CHIEF COMPLAINT:  Right upper quadrant abdominal pain.  HISTORY OF PRESENT ILLNESS:  The patient is a 35 year old white female who presents with intermittent right upper quadrant abdominal pain, nausea, and indigestion for many years.  She has a known history of cholelithiasis.  Recently, her symptoms have worsened.  No fever, chills, or jaundice noted.  The pain does radiate to the right flank.  PAST MEDICAL HISTORY:  Includes IBS, intermittent asthma.  PAST SURGICAL HISTORY:  Multiple C-sections.  CURRENT MEDICATIONS:  Albuterol p.r.n., Protonix p.r.n.  ALLERGIES:  No known drug allergies.  REVIEW OF SYSTEMS:  The patient smokes a pack cigarettes a day.  She denies any alcohol or illicit drug use.  She denies any other cardiopulmonary difficulties or bleeding disorders.  FAMILY MEDICAL HISTORY:  Noncontributory.  PHYSICAL EXAMINATION:  GENERAL:  The patient is a well-developed, well- nourished white female, in no acute distress. HEENT:  Unremarkable. NECK:  Supple without lymphadenopathy. HEART:  Reveals a regular rate and rhythm, without S3, S4, murmurs. ABDOMEN:  Soft, nontender, nondistended.  She has slight tenderness to deep palpation in the right upper quadrant.  No hepatosplenomegaly, masses, or hernias identified.  Ultrasound of the gallbladder reveals cholelithiasis with a normal common bile duct.  IMPRESSION:  Cholecystitis, cholelithiasis.  PLAN:  The patient is scheduled for laparoscopic cholecystectomy on December 26, 2010.  Risks and benefits of the procedure including bleeding, infection, possibility of an operative procedure were  fully explained to the patient, gave informed consent.     Dalia Heading, M.D.     MAJ/MEDQ  D:  12/21/2010  T:  12/22/2010  Job:  [id number]  cc:   Chase Picket at [facility]  Electronically Signed by Franky Macho M.D. on 12/25/2010 07:47:39 PM

## 2010-12-26 ENCOUNTER — Ambulatory Visit (HOSPITAL_COMMUNITY)
Admission: RE | Admit: 2010-12-26 | Discharge: 2010-12-26 | Disposition: A | Discharge status: Home / Self Care | Payer: BC Managed Care – PPO | Source: Ambulatory Visit | Attending: General Surgery | Admitting: General Surgery

## 2010-12-26 ENCOUNTER — Other Ambulatory Visit: Payer: Self-pay | Admitting: General Surgery

## 2010-12-26 DIAGNOSIS — Z01818 Encounter for other preprocedural examination: Secondary | ICD-10-CM | POA: Insufficient documentation

## 2010-12-26 DIAGNOSIS — Z01812 Encounter for preprocedural laboratory examination: Secondary | ICD-10-CM | POA: Insufficient documentation

## 2010-12-26 DIAGNOSIS — K8020 Calculus of gallbladder without cholecystitis without obstruction: Secondary | ICD-10-CM | POA: Insufficient documentation

## 2010-12-28 NOTE — Op Note (Signed)
  [name], [name] NO.:  [id number]  MEDICAL RECORD NO.:  [id number]  LOCATION:  DAYP                          FACILITY:  [facility]  PHYSICIAN:  Dalia Heading, M.D.  DATE OF BIRTH:  1976-06-17  DATE OF PROCEDURE:  12/26/2010 DATE OF DISCHARGE:                              OPERATIVE REPORT   PREOPERATIVE DIAGNOSES:  Cholecystitis, cholelithiasis.  POSTOPERATIVE DIAGNOSES:  Cholecystitis, cholelithiasis.  PROCEDURE:  Laparoscopic cholecystectomy.  SURGEON:  Dalia Heading, MD  ANESTHESIA:  General endotracheal.  INDICATIONS:  The patient is a 35 year old white female who presents with biliary colic secondary to cholelithiasis.  The risks and benefits of the procedure including bleeding, infection, hepatobiliary injury, and the possibly of an open procedure were fully explained to the patient, gave informed consent.  PROCEDURE NOTE:  The patient was placed in the supine position.  After induction of general endotracheal anesthesia, the abdomen was prepped and draped in the usual sterile technique with Betadine.  Surgical site confirmation was performed.  Supraumbilical incision was made down to the fascia.  A Veress needle was introduced into the abdominal cavity and confirmation of placement was done using the saline drop test.  The abdomen was then insufflated to 16 mmHg pressure.  An 11-mm trocar was introduced into the abdominal cavity under direct visualization without difficulty.  The patient was placed in reversed Trendelenburg position.  An additional 11-mm trocar was placed in the epigastric region and 5-mm trocar was placed in the right upper quadrant and right flank regions.  Liver was inspected and noted to be within normal limits.  The gallbladder was retracted superior and laterally.  The dissection was begun around the infundibulum of the gallbladder.  The cystic duct was first identified. Juncture to the infundibulum fully identified.   EndoClips were placed proximally and distally on the cystic duct and the cystic duct was divided.  This was likewise done to cystic artery.  The gallbladder was then freed away from the gallbladder fossa using Bovie electrocautery. The gallbladder was delivered through the epigastric trocar site using EndoCatch bag.  The gallbladder fossa was inspected and no abnormal bleeding or bile leakage was noted.  Surgicel was placed in the gallbladder fossa.  All fluid and air were then evacuated from the abdominal cavity prior to removal of the trocars.  All wounds were irrigated with normal saline.  All wounds were injected with 0.5% Sensorcaine.  The supraumbilical fascia as well as epigastric fascia reapproximated using 0 Vicryl interrupted sutures.  All skin incisions were closed using staples.  Betadine ointment and dry sterile dressings were applied.  All tape and needle counts were correct at the end of the procedure. The patient was extubated in the operating room, went back to recovery room, awake in stable condition.  COMPLICATIONS:  None.  SPECIMEN:  Gallbladder.  BLOOD LOSS:  Minimal.     Dalia Heading, M.D.     MAJ/MEDQ  D:  12/26/2010  T:  12/27/2010  Job:  [id number]  Electronically Signed by Franky Macho M.D. on 12/28/2010 06:31:43 PM

## 2011-04-24 LAB — URINALYSIS, ROUTINE W REFLEX MICROSCOPIC
Bilirubin Urine: NEGATIVE
Glucose, UA: NEGATIVE
Hgb urine dipstick: NEGATIVE
Ketones, ur: NEGATIVE
Nitrite: NEGATIVE
Protein, ur: NEGATIVE
Specific Gravity, Urine: >1.03 — ABNORMAL HIGH
Urobilinogen, UA: 0.2
pH: 5.5

## 2011-04-24 LAB — PREGNANCY, URINE: Preg Test, Ur: NEGATIVE

## 2011-05-07 LAB — I-STAT 8, (EC8 V) (CONVERTED LAB)
Acid-Base Excess: 2
BUN: 10
Bicarbonate: 29.5 — ABNORMAL HIGH
Chloride: 104
Glucose, Bld: 94
HCT: 49 — ABNORMAL HIGH
Hemoglobin: 16.7 — ABNORMAL HIGH
Operator id: 146091
Potassium: 3.8
Sodium: 139
TCO2: 31
pCO2, Ven: 54.2 — ABNORMAL HIGH
pH, Ven: 7.344 — ABNORMAL HIGH

## 2011-05-07 LAB — RPR: RPR Ser Ql: NONREACTIVE

## 2011-05-07 LAB — CBC
HCT: 44.1
Hemoglobin: 15.1 — ABNORMAL HIGH
MCHC: 34.2
MCV: 89.1
Platelets: 285
RBC: 4.95
RDW: 14.4 — ABNORMAL HIGH
WBC: 12.1 — ABNORMAL HIGH

## 2011-05-07 LAB — DIFFERENTIAL
Basophils Absolute: 0.1
Basophils Relative: 0
Eosinophils Absolute: 0.1
Eosinophils Relative: 1
Lymphocytes Relative: 19
Lymphs Abs: 2.3
Monocytes Absolute: 0.8 — ABNORMAL HIGH
Monocytes Relative: 6
Neutro Abs: 8.9 — ABNORMAL HIGH
Neutrophils Relative %: 73

## 2011-05-07 LAB — URINALYSIS, ROUTINE W REFLEX MICROSCOPIC
Bilirubin Urine: NEGATIVE
Glucose, UA: NEGATIVE
Ketones, ur: NEGATIVE
Nitrite: POSITIVE — AB
Protein, ur: >300 — AB
Specific Gravity, Urine: 1.02
Urobilinogen, UA: 0.2
pH: 6

## 2011-05-07 LAB — WET PREP, GENITAL
Trich, Wet Prep: NONE SEEN
WBC, Wet Prep HPF POC: NONE SEEN
Yeast Wet Prep HPF POC: NONE SEEN

## 2011-05-07 LAB — POCT I-STAT CREATININE
Creatinine, Ser: 1
Operator id: 146091

## 2011-05-07 LAB — RAPID URINE DRUG SCREEN, HOSP PERFORMED
Amphetamines: NOT DETECTED
Barbiturates: NOT DETECTED
Benzodiazepines: NOT DETECTED
Cocaine: NOT DETECTED
Opiates: NOT DETECTED
Tetrahydrocannabinol: POSITIVE — AB

## 2011-05-07 LAB — URINE MICROSCOPIC-ADD ON

## 2011-05-07 LAB — POCT PREGNANCY, URINE
Operator id: 146091
Preg Test, Ur: NEGATIVE

## 2011-05-07 LAB — GC/CHLAMYDIA PROBE AMP, GENITAL
Chlamydia, DNA Probe: NEGATIVE
GC Probe Amp, Genital: NEGATIVE

## 2011-05-08 LAB — HEPATIC FUNCTION PANEL
ALT: 22
AST: 17
Albumin: 3.4 — ABNORMAL LOW
Alkaline Phosphatase: 74
Bilirubin, Direct: 0.1
Indirect Bilirubin: 0.4
Total Bilirubin: 0.5
Total Protein: 6.5

## 2011-05-08 LAB — I-STAT 8, (EC8 V) (CONVERTED LAB)
Acid-base deficit: 2
BUN: 11
Bicarbonate: 23.5
Chloride: 106
Glucose, Bld: 103 — ABNORMAL HIGH
HCT: 50 — ABNORMAL HIGH
Hemoglobin: 17 — ABNORMAL HIGH
Operator id: 272551
Potassium: 3.6
Sodium: 139
TCO2: 25
pCO2, Ven: 41.9 — ABNORMAL LOW
pH, Ven: 7.356 — ABNORMAL HIGH

## 2011-05-08 LAB — BASIC METABOLIC PANEL
BUN: 12
CO2: 24
Calcium: 9.2
Chloride: 107
Creatinine, Ser: 0.91
GFR calc Af Amer: >60
GFR calc non Af Amer: >60
Glucose, Bld: 106 — ABNORMAL HIGH
Potassium: 3.4 — ABNORMAL LOW
Sodium: 139

## 2011-05-08 LAB — CBC
HCT: 46.1 — ABNORMAL HIGH
Hemoglobin: 15.8 — ABNORMAL HIGH
MCHC: 34.2
MCV: 87.8
Platelets: 274
RBC: 5.24 — ABNORMAL HIGH
RDW: 13.3
WBC: 9.9

## 2011-05-08 LAB — RAPID URINE DRUG SCREEN, HOSP PERFORMED
Amphetamines: NOT DETECTED
Barbiturates: NOT DETECTED
Benzodiazepines: NOT DETECTED
Cocaine: POSITIVE — AB
Opiates: NOT DETECTED
Tetrahydrocannabinol: POSITIVE — AB

## 2011-05-08 LAB — DIFFERENTIAL
Basophils Absolute: 0
Basophils Relative: 0
Eosinophils Absolute: 0
Eosinophils Relative: 0
Lymphocytes Relative: 21
Lymphs Abs: 2.1
Monocytes Absolute: 0.6
Monocytes Relative: 6
Neutro Abs: 7.2
Neutrophils Relative %: 73

## 2011-05-08 LAB — TSH: TSH: 2.595

## 2011-05-08 LAB — POCT PREGNANCY, URINE
Operator id: 198171
Preg Test, Ur: NEGATIVE

## 2011-05-08 LAB — ETHANOL: Alcohol, Ethyl (B): <5

## 2012-12-16 ENCOUNTER — Encounter (HOSPITAL_COMMUNITY): Payer: Self-pay

## 2012-12-16 ENCOUNTER — Emergency Department (HOSPITAL_COMMUNITY): Payer: Self-pay

## 2012-12-16 ENCOUNTER — Emergency Department (HOSPITAL_COMMUNITY)
Admission: EM | Admit: 2012-12-16 | Discharge: 2012-12-16 | Disposition: A | Discharge status: Home / Self Care | Payer: Self-pay | Attending: Emergency Medicine | Admitting: Emergency Medicine

## 2012-12-16 DIAGNOSIS — J209 Acute bronchitis, unspecified: Secondary | ICD-10-CM | POA: Insufficient documentation

## 2012-12-16 DIAGNOSIS — R059 Cough, unspecified: Secondary | ICD-10-CM | POA: Insufficient documentation

## 2012-12-16 DIAGNOSIS — R509 Fever, unspecified: Secondary | ICD-10-CM | POA: Insufficient documentation

## 2012-12-16 DIAGNOSIS — Z79899 Other long term (current) drug therapy: Secondary | ICD-10-CM | POA: Insufficient documentation

## 2012-12-16 DIAGNOSIS — F17200 Nicotine dependence, unspecified, uncomplicated: Secondary | ICD-10-CM | POA: Insufficient documentation

## 2012-12-16 DIAGNOSIS — J3489 Other specified disorders of nose and nasal sinuses: Secondary | ICD-10-CM | POA: Insufficient documentation

## 2012-12-16 DIAGNOSIS — R05 Cough: Secondary | ICD-10-CM | POA: Insufficient documentation

## 2012-12-16 DIAGNOSIS — M542 Cervicalgia: Secondary | ICD-10-CM | POA: Insufficient documentation

## 2012-12-16 DIAGNOSIS — J40 Bronchitis, not specified as acute or chronic: Secondary | ICD-10-CM

## 2012-12-16 MED ORDER — PREDNISONE 20 MG PO TABS: ORAL_TABLET | ORAL | Status: DC

## 2012-12-16 MED ORDER — AZITHROMYCIN 250 MG PO TABS
500.0000 mg | ORAL_TABLET | Freq: Once | ORAL | Status: AC
  Administered 2012-12-16: 500 mg via ORAL
  Filled 2012-12-16: qty 2

## 2012-12-16 MED ORDER — AZITHROMYCIN 250 MG PO TABS: ORAL_TABLET | ORAL | Status: DC

## 2012-12-16 NOTE — ED Notes (Signed)
Pt c/o cough, congestion, and low grade fever for the past week.  Pt says took some of her daughters prednisone over the past 3 days.  C/O soreness in neck from coughing and generalized weakness.

## 2012-12-16 NOTE — ED Provider Notes (Signed)
History     CSN: [id number]  Arrival date & time 12/16/12  4098   First MD Initiated Contact with Patient 12/16/12 [phone]      Chief Complaint  Patient presents with  . URI    (Consider location/radiation/quality/duration/timing/severity/associated sxs/prior treatment) Patient is a 37 y.o. female presenting with URI. The history is provided by the patient.  URI Presenting symptoms: congestion, cough, fever and rhinorrhea   Presenting symptoms: no ear pain, no fatigue and no sore throat   Congestion:    Location:  Nasal and chest   Interferes with sleep: no     Interferes with eating/drinking: no   Cough:    Cough characteristics:  Non-productive   Sputum characteristics:  Clear and yellow   Severity:  Mild   Onset quality:  Gradual   Duration:  3 days   Timing:  Sporadic   Progression:  Unchanged   Chronicity:  New Severity:  Mild Onset quality:  Gradual Progression:  Unchanged Chronicity:  New Relieved by:  Nothing Worsened by:  Nothing tried Ineffective treatments:  None tried (prednisone for 3 days) Associated symptoms: neck pain, sinus pain and swollen glands   Associated symptoms: no arthralgias, no headaches, no myalgias, no sneezing and no wheezing     History reviewed. No pertinent past medical history.  Past Surgical History  Procedure Laterality Date  . Cesarean section    . Cholecystectomy      No family history on file.  History  Substance Use Topics  . Smoking status: Current Every Day Smoker  . Smokeless tobacco: Not on file  . Alcohol Use: No    OB History   Grav Para Term Preterm Abortions TAB SAB Ect Mult Living                  Review of Systems  Constitutional: Positive for fever and chills. Negative for activity change, appetite change and fatigue.  HENT: Positive for congestion, rhinorrhea, neck pain and sinus pressure. Negative for ear pain, sore throat, sneezing, trouble swallowing and voice change.   Eyes: Negative for visual  disturbance.  Respiratory: Positive for cough. Negative for chest tightness, shortness of breath and wheezing.   Cardiovascular: Negative for chest pain.  Gastrointestinal: Negative for nausea, vomiting, abdominal pain and diarrhea.  Genitourinary: Negative for dysuria.  Musculoskeletal: Negative for myalgias and arthralgias.  Skin: Negative for rash.  Neurological: Negative for dizziness, syncope, speech difficulty, weakness and headaches.  All other systems reviewed and are negative.    Allergies  Codeine  Home Medications   Current Outpatient Rx  Name  Route  Sig  Dispense  Refill  . acetaminophen (TYLENOL) 325 MG tablet   Oral   Take 650 mg by mouth every 6 (six) hours as needed for pain.         Marland Kitchen ibuprofen (ADVIL,MOTRIN) 200 MG tablet   Oral   Take 400 mg by mouth every 6 (six) hours as needed for pain.         Marland Kitchen PREDNISONE PO   Oral   Take 2 tablets by mouth.         . Pseudoephedrine-DM-GG (ROBITUSSIN COLD & COUGH PO)   Oral   Take 5-10 mLs by mouth daily as needed (cold).           BP 130/68  Pulse 80  Temp(Src) 98.5 F (36.9 C) (Oral)  Resp 20  Ht 5\' 4"  (1.626 m)  Wt 235 lb (106.595 kg)  BMI 40.32 kg/m2  SpO2 97%  LMP 12/02/2012  Physical Exam  Nursing note and vitals reviewed. Constitutional: She appears well-developed and well-nourished. No distress.  HENT:  Head: Normocephalic and atraumatic.  Eyes: Conjunctivae and EOM are normal. Pupils are equal, round, and reactive to light.  Neck: Normal range of motion, full passive range of motion without pain and phonation normal. No Brudzinski's sign and no Kernig's sign noted. No thyromegaly present.  Cardiovascular: Normal rate, regular rhythm, normal heart sounds and intact distal pulses.   No murmur heard. Pulmonary/Chest: Effort normal and breath sounds normal. No respiratory distress. She exhibits no tenderness.  Abdominal: Soft. She exhibits no distension. There is no tenderness. There is  no rebound and no guarding.  Musculoskeletal: Normal range of motion. She exhibits no edema.  Lymphadenopathy:    She has no cervical adenopathy.  Neurological: She is alert. She exhibits normal muscle tone. Coordination normal.  Skin: Skin is warm and dry.    ED Course  Procedures (including critical care time)  Labs Reviewed - No data to display Dg Chest 2 View  12/16/2012   *RADIOLOGY REPORT*  Clinical Data: Cough, congestion, short of breath and fever for 1 week  CHEST - 2 VIEW  Comparison: Chest x-ray of 11/22/2009  Findings: No definite pneumonia is seen.  However, minimally prompt markings are noted posteriorly at the lung base, which may represent atelectasis or perhaps minimal pneumonia.  Follow-up is recommended.  No effusion is seen.  Mediastinal contours appear normal.  The heart is within normal limits in size.  No bony abnormality is seen.  IMPRESSION: No definite pneumonia but minimally prompt markings on the lateral view posteriorly may represent atelectasis or a minimal focus of pneumonia.  Consider follow-up.   Original Report Authenticated By: Dwyane Dee, M.D.        MDM   Patient is alert, VSS.  No tachycardia, tachypnea or hypoxia.  Mucous membranes are moist.  Ambulates with a steady gait.    I have discussed the x-ray findings with the patient. Patient is non-toxic appearing and stable for d/c.  Agrees to rturh here if sx's worsen       Spiro Ausborn L. Trisha Mangle, PA-C 12/20/12 2238

## 2012-12-21 NOTE — ED Provider Notes (Signed)
Medical screening examination/treatment/procedure(s) were performed by non-physician practitioner and as supervising physician I was immediately available for consultation/collaboration.   Edgard Debord W. Coady Train, MD 12/21/12 1555 

## 2012-12-31 ENCOUNTER — Encounter (HOSPITAL_COMMUNITY): Payer: Self-pay | Admitting: Emergency Medicine

## 2012-12-31 ENCOUNTER — Emergency Department (HOSPITAL_COMMUNITY)
Admission: EM | Admit: 2012-12-31 | Discharge: 2012-12-31 | Disposition: A | Discharge status: Home / Self Care | Payer: Self-pay | Attending: Emergency Medicine | Admitting: Emergency Medicine

## 2012-12-31 DIAGNOSIS — Z8739 Personal history of other diseases of the musculoskeletal system and connective tissue: Secondary | ICD-10-CM | POA: Insufficient documentation

## 2012-12-31 DIAGNOSIS — R52 Pain, unspecified: Secondary | ICD-10-CM | POA: Insufficient documentation

## 2012-12-31 DIAGNOSIS — IMO0002 Reserved for concepts with insufficient information to code with codable children: Secondary | ICD-10-CM | POA: Insufficient documentation

## 2012-12-31 DIAGNOSIS — G8929 Other chronic pain: Secondary | ICD-10-CM | POA: Insufficient documentation

## 2012-12-31 DIAGNOSIS — F17200 Nicotine dependence, unspecified, uncomplicated: Secondary | ICD-10-CM | POA: Insufficient documentation

## 2012-12-31 DIAGNOSIS — R05 Cough: Secondary | ICD-10-CM | POA: Insufficient documentation

## 2012-12-31 DIAGNOSIS — R059 Cough, unspecified: Secondary | ICD-10-CM | POA: Insufficient documentation

## 2012-12-31 DIAGNOSIS — M5416 Radiculopathy, lumbar region: Secondary | ICD-10-CM

## 2012-12-31 DIAGNOSIS — Z79899 Other long term (current) drug therapy: Secondary | ICD-10-CM | POA: Insufficient documentation

## 2012-12-31 MED ORDER — DIAZEPAM 5 MG PO TABS: 5.0000 mg | ORAL_TABLET | Freq: Three times a day (TID) | ORAL | Status: DC | PRN

## 2012-12-31 MED ORDER — PREDNISONE 10 MG PO TABS: ORAL_TABLET | ORAL | Status: DC

## 2012-12-31 MED ORDER — PREDNISONE 50 MG PO TABS
60.0000 mg | ORAL_TABLET | Freq: Once | ORAL | Status: AC
  Administered 2012-12-31: 60 mg via ORAL
  Filled 2012-12-31: qty 1

## 2012-12-31 NOTE — ED Provider Notes (Signed)
History     CSN: [id number]  Arrival date & time 12/31/12  1508   First MD Initiated Contact with Patient 12/31/12 1638      Chief Complaint  Patient presents with  . Back Pain    (Consider location/radiation/quality/duration/timing/severity/associated sxs/prior treatment) HPI Comments: [name] is a 37 y.o. Female presenting with acute on chronic low back pain which has which has been worsened for the past 13 days.  She describes recently getting over a pneumonia for which she has completed a course of antibiotics but had persistent nonproductive coughing up until 2 weeks ago.  She describes coughing  as she was leaning forward about 2 weeks ago when she developed acute worsening of her chronic low back pain.  She has been using ibuprofen and then diclofenac which she has been prescribed previously for back pain and also using ice and heat along with rest and has had no significant improvement in her symptoms.  She does have a history of degenerative disc disease which was first diagnosed in 2006 and states she has 4 "bulging discs " in her lower back.   Patient denies any new injury specifically.  There is radiation into the right lower extremity.  There has been no weakness or numbness in the lower extremities and no urinary or bowel retention or incontinence.  Patient does not have a history of cancer or IVDU.      The history is provided by the patient.    History reviewed. No pertinent past medical history.  Past Surgical History  Procedure Laterality Date  . Cesarean section    . Cholecystectomy      No family history on file.  History  Substance Use Topics  . Smoking status: Current Every Day Smoker  . Smokeless tobacco: Not on file  . Alcohol Use: No    OB History   Grav Para Term Preterm Abortions TAB SAB Ect Mult Living                  Review of Systems  Constitutional: Negative for fever.  Respiratory: Negative for shortness of breath.    Cardiovascular: Negative for chest pain and leg swelling.  Gastrointestinal: Negative for abdominal pain, constipation and abdominal distention.  Genitourinary: Negative for dysuria, urgency, frequency, flank pain and difficulty urinating.  Musculoskeletal: Positive for back pain. Negative for joint swelling and gait problem.  Skin: Negative for rash.  Neurological: Negative for weakness and numbness.    Allergies  Codeine  Home Medications   Current Outpatient Rx  Name  Route  Sig  Dispense  Refill  . acetaminophen (TYLENOL) 325 MG tablet   Oral   Take 650 mg by mouth every 6 (six) hours as needed for pain.         Marland Kitchen azithromycin (ZITHROMAX Z-PAK) 250 MG tablet      Take two tablets on day one, then one tab qd days 2-5   6 tablet   0   . diazepam (VALIUM) 5 MG tablet   Oral   Take 1 tablet (5 mg total) by mouth every 8 (eight) hours as needed (muscle spasm).   15 tablet   0   . ibuprofen (ADVIL,MOTRIN) 200 MG tablet   Oral   Take 400 mg by mouth every 6 (six) hours as needed for pain.         . predniSONE (DELTASONE) 10 MG tablet      6, 5, 4, 3, 2 then 1 tablet by  mouth daily for 6 days total.   21 tablet   0   . predniSONE (DELTASONE) 20 MG tablet      Take 3 tablets po qd x 2 days, then 2 tablets po qd x 2 days, then 1 tablet po qd x 2 days   12 tablet   0   . PREDNISONE PO   Oral   Take 2 tablets by mouth.         . Pseudoephedrine-DM-GG (ROBITUSSIN COLD & COUGH PO)   Oral   Take 5-10 mLs by mouth daily as needed (cold).           BP 129/81  Pulse 80  Temp(Src) 97.6 F (36.4 C)  Resp 18  Ht 5\' 4"  (1.626 m)  Wt 235 lb (106.595 kg)  BMI 40.32 kg/m2  SpO2 100%  LMP 12/31/2012  Physical Exam  Nursing note and vitals reviewed. Constitutional: She appears well-developed and well-nourished.  HENT:  Head: Normocephalic.  Eyes: Conjunctivae are normal.  Neck: Normal range of motion. Neck supple.  Cardiovascular: Normal rate and intact  distal pulses.   Pedal pulses normal.  Pulmonary/Chest: Effort normal.  Abdominal: Soft. Bowel sounds are normal. She exhibits no distension and no mass.  Musculoskeletal: Normal range of motion. She exhibits no edema.       Lumbar back: She exhibits tenderness. She exhibits no swelling, no edema and no spasm.  Neurological: She is alert. She has normal strength. She displays no atrophy and no tremor. No sensory deficit. Gait normal.  Reflex Scores:      Patellar reflexes are 2+ on the right side and 2+ on the left side.      Achilles reflexes are 2+ on the right side and 2+ on the left side. No strength deficit noted in hip and knee flexor and extensor muscle groups.  Ankle flexion and extension intact.  Skin: Skin is warm and dry.  Psychiatric: She has a normal mood and affect.    ED Course  Procedures (including critical care time)  Labs Reviewed - No data to display No results found.   1. Lumbar radiculopathy, acute       MDM  Discussed.  Treatment options, patient was prescribed a six-day prednisone taper.  She was recently on a prednisone taper, although completed this prior to her back injury.  She was also prescribed Valium for muscle spasm.  Encouraged rest, minimize activities that worsen pain, heating pad 20 minutes several times daily.  She was given referrals for obtaining primary care.  No neuro deficit on exam or by history to suggest emergent or surgical presentation.  Also discussed worsened sx that should prompt immediate re-evaluation including distal weakness, bowel/bladder retention/incontinence.              Burgess Amor, PA-C 01/01/13 0207

## 2012-12-31 NOTE — ED Notes (Signed)
Pt c/o back pain x 13 days.

## 2012-12-31 NOTE — ED Notes (Signed)
Pt presents with chronic back pain. With worsening pain x 2 days.  Denies incontinence of bowel and bladder function. NAD noted.

## 2013-01-05 NOTE — ED Provider Notes (Signed)
Medical screening examination/treatment/procedure(s) were performed by non-physician practitioner and as supervising physician I was immediately available for consultation/collaboration.  Netasha Wehrli, MD 01/05/13 1536 

## 2013-05-26 ENCOUNTER — Emergency Department (HOSPITAL_COMMUNITY)
Admission: EM | Admit: 2013-05-26 | Discharge: 2013-05-26 | Disposition: A | Discharge status: Home / Self Care | Payer: Self-pay | Attending: Emergency Medicine | Admitting: Emergency Medicine

## 2013-05-26 ENCOUNTER — Encounter (HOSPITAL_COMMUNITY): Payer: Self-pay | Admitting: Emergency Medicine

## 2013-05-26 DIAGNOSIS — J329 Chronic sinusitis, unspecified: Secondary | ICD-10-CM | POA: Insufficient documentation

## 2013-05-26 DIAGNOSIS — R05 Cough: Secondary | ICD-10-CM

## 2013-05-26 DIAGNOSIS — R059 Cough, unspecified: Secondary | ICD-10-CM | POA: Insufficient documentation

## 2013-05-26 DIAGNOSIS — F17200 Nicotine dependence, unspecified, uncomplicated: Secondary | ICD-10-CM | POA: Insufficient documentation

## 2013-05-26 DIAGNOSIS — R51 Headache: Secondary | ICD-10-CM | POA: Insufficient documentation

## 2013-05-26 MED ORDER — BENZONATATE 200 MG PO CAPS: 200.0000 mg | ORAL_CAPSULE | Freq: Three times a day (TID) | ORAL | Status: DC | PRN

## 2013-05-26 MED ORDER — AZITHROMYCIN 250 MG PO TABS: ORAL_TABLET | ORAL | Status: DC

## 2013-05-26 NOTE — ED Notes (Signed)
Pt states "Thick yellow snot". States pressure to facial area due to sinuses. Chest congestion. Nonproductive cough. Symptoms began Sunday

## 2013-05-28 NOTE — ED Provider Notes (Signed)
CSN: [id number]     Arrival date & time 05/26/13  1056 History   First MD Initiated Contact with Patient 05/26/13 1114     Chief Complaint  Patient presents with  . Cough  . Facial Pain   (Consider location/radiation/quality/duration/timing/severity/associated sxs/prior Treatment) Patient is a 37 y.o. female presenting with cough. The history is provided by the patient.  Cough Cough characteristics:  Productive Sputum characteristics:  Yellow Severity:  Mild Onset quality:  Gradual Duration:  2 days Timing:  Intermittent Progression:  Unchanged Chronicity:  New Smoker: yes   Context: sick contacts and upper respiratory infection   Relieved by:  Nothing Worsened by:  Activity and lying down Ineffective treatments:  None tried Associated symptoms: sinus congestion   Associated symptoms: no chest pain, no chills, no ear pain, no fever, no headaches, no myalgias, no rash, no rhinorrhea, no shortness of breath, no sore throat, no weight loss and no wheezing   Associated symptoms comment:  Sinus pressure   History reviewed. No pertinent past medical history. Past Surgical History  Procedure Laterality Date  . Cesarean section    . Cholecystectomy     No family history on file. History  Substance Use Topics  . Smoking status: Current Every Day Smoker  . Smokeless tobacco: Not on file  . Alcohol Use: No   OB History   Grav Para Term Preterm Abortions TAB SAB Ect Mult Living                 Review of Systems  Constitutional: Negative for fever, chills, weight loss, activity change and appetite change.  HENT: Positive for congestion and sinus pressure. Negative for ear pain, facial swelling, rhinorrhea, sore throat and trouble swallowing.   Eyes: Negative for visual disturbance.  Respiratory: Positive for cough. Negative for chest tightness, shortness of breath, wheezing and stridor.   Cardiovascular: Negative for chest pain.  Gastrointestinal: Negative for nausea and  vomiting.  Musculoskeletal: Negative for myalgias, neck pain and neck stiffness.  Skin: Negative.  Negative for rash.  Neurological: Negative for dizziness, weakness, numbness and headaches.  Hematological: Negative for adenopathy.  Psychiatric/Behavioral: Negative for confusion.  All other systems reviewed and are negative.    Allergies  Codeine  Home Medications   Current Outpatient Rx  Name  Route  Sig  Dispense  Refill  . acetaminophen (TYLENOL) 500 MG tablet   Oral   Take 1,000 mg by mouth every 6 (six) hours as needed for mild pain.         Marland Kitchen ibuprofen (ADVIL,MOTRIN) 200 MG tablet   Oral   Take 800 mg by mouth every 6 (six) hours as needed for pain.          Marland Kitchen azithromycin (ZITHROMAX Z-PAK) 250 MG tablet      Take two tablets on day one, then one tab qd days 2-5   6 tablet   0   . benzonatate (TESSALON) 200 MG capsule   Oral   Take 1 capsule (200 mg total) by mouth 3 (three) times daily as needed for cough. Swallow whole, do not chew   21 capsule   0    BP 141/72  Pulse 85  Temp(Src) 98.3 F (36.8 C) (Oral)  Resp 18  Ht 5\' 4"  (1.626 m)  Wt 245 lb (111.131 kg)  BMI 42.03 kg/m2  SpO2 99%  LMP 05/19/2013 Physical Exam  Nursing note and vitals reviewed. Constitutional: She is oriented to person, place, and time. She appears well-developed  and well-nourished. No distress.  HENT:  Head: Normocephalic and atraumatic.  Right Ear: Tympanic membrane and ear canal normal.  Left Ear: Tympanic membrane and ear canal normal.  Nose: Mucosal edema and rhinorrhea present. Right sinus exhibits maxillary sinus tenderness and frontal sinus tenderness. Left sinus exhibits maxillary sinus tenderness and frontal sinus tenderness.  Mouth/Throat: Uvula is midline and mucous membranes are normal. No trismus in the jaw. No uvula swelling. No oropharyngeal exudate, posterior oropharyngeal edema, posterior oropharyngeal erythema or tonsillar abscesses.  Eyes: Conjunctivae are  normal.  Neck: Normal range of motion, full passive range of motion without pain and phonation normal. Neck supple. No Brudzinski's sign and no Kernig's sign noted.  Cardiovascular: Normal rate, regular rhythm, normal heart sounds and intact distal pulses.   No murmur heard. Pulmonary/Chest: Effort normal and breath sounds normal. No respiratory distress. She has no wheezes. She has no rales.  Abdominal: Soft. She exhibits no distension. There is no tenderness. There is no rebound and no guarding.  Musculoskeletal: She exhibits no edema.  Lymphadenopathy:    She has no cervical adenopathy.  Neurological: She is alert and oriented to person, place, and time. She exhibits normal muscle tone. Coordination normal.  Skin: Skin is warm and dry.    ED Course  Procedures (including critical care time) Labs Review Labs Reviewed - No data to display Imaging Review No results found.  EKG Interpretation   None       MDM   1. Sinusitis   2. Cough    patient is well appearing, non-toxic.  VSS.  No headache or meningeal signs.  Will treat with z pack and tessalon perles.  Pt agrees to f/u with PMD if needed. Appears stable for discharge.    Hazen Brumett L. Rafferty Postlewait, PA-C 05/28/13 1331

## 2013-05-29 NOTE — ED Provider Notes (Signed)
Medical screening examination/treatment/procedure(s) were performed by non-physician practitioner and as supervising physician I was immediately available for consultation/collaboration.  EKG Interpretation   None         Glynn Octave, MD 05/29/13 [phone]

## 2013-07-18 ENCOUNTER — Encounter (HOSPITAL_COMMUNITY): Payer: Self-pay | Admitting: Emergency Medicine

## 2013-07-18 ENCOUNTER — Emergency Department (HOSPITAL_COMMUNITY)
Admission: EM | Admit: 2013-07-18 | Discharge: 2013-07-18 | Disposition: A | Discharge status: Home / Self Care | Payer: Self-pay | Attending: Emergency Medicine | Admitting: Emergency Medicine

## 2013-07-18 DIAGNOSIS — F17200 Nicotine dependence, unspecified, uncomplicated: Secondary | ICD-10-CM | POA: Insufficient documentation

## 2013-07-18 DIAGNOSIS — Y939 Activity, unspecified: Secondary | ICD-10-CM | POA: Insufficient documentation

## 2013-07-18 DIAGNOSIS — W57XXXA Bitten or stung by nonvenomous insect and other nonvenomous arthropods, initial encounter: Secondary | ICD-10-CM

## 2013-07-18 DIAGNOSIS — IMO0002 Reserved for concepts with insufficient information to code with codable children: Secondary | ICD-10-CM | POA: Insufficient documentation

## 2013-07-18 DIAGNOSIS — Y929 Unspecified place or not applicable: Secondary | ICD-10-CM | POA: Insufficient documentation

## 2013-07-18 MED ORDER — SULFAMETHOXAZOLE-TRIMETHOPRIM 800-160 MG PO TABS: 1.0000 | ORAL_TABLET | Freq: Two times a day (BID) | ORAL | Status: DC

## 2013-07-18 NOTE — ED Provider Notes (Signed)
Medical screening examination/treatment/procedure(s) were performed by non-physician practitioner and as supervising physician I was immediately available for consultation/collaboration.  EKG Interpretation   None         Tanyon Alipio L Teon Hudnall, MD 07/18/13 2346 

## 2013-07-18 NOTE — ED Provider Notes (Signed)
CSN: [id number]     Arrival date & time 07/18/13  1639 History   First MD Initiated Contact with Patient 07/18/13 1825     Chief Complaint  Patient presents with  . Insect Bite   (Consider location/radiation/quality/duration/timing/severity/associated sxs/prior Treatment) HPI Comments: Patient presents to the emergency department with chief complaint of insect bite. She states that she noticed a small wound between her fourth and fifth toes on the left foot on Tuesday. She states that it is very painful. She has not tried taking anything to alleviate the symptoms. She has tried using Epson salt soaks for comfort with some relief. She states it is painful most of the time. She denies any known mechanism of injury.  The history is provided by the patient. No language interpreter was used.    History reviewed. No pertinent past medical history. Past Surgical History  Procedure Laterality Date  . Cesarean section    . Cholecystectomy     No family history on file. History  Substance Use Topics  . Smoking status: Current Every Day Smoker  . Smokeless tobacco: Not on file  . Alcohol Use: No   OB History   Grav Para Term Preterm Abortions TAB SAB Ect Mult Living                 Review of Systems  All other systems reviewed and are negative.    Allergies  Codeine  Home Medications   Current Outpatient Rx  Name  Route  Sig  Dispense  Refill  . acetaminophen (TYLENOL) 500 MG tablet   Oral   Take 1,000 mg by mouth every 6 (six) hours as needed for mild pain.         Marland Kitchen azithromycin (ZITHROMAX Z-PAK) 250 MG tablet      Take two tablets on day one, then one tab qd days 2-5   6 tablet   0   . benzonatate (TESSALON) 200 MG capsule   Oral   Take 1 capsule (200 mg total) by mouth 3 (three) times daily as needed for cough. Swallow whole, do not chew   21 capsule   0   . ibuprofen (ADVIL,MOTRIN) 200 MG tablet   Oral   Take 800 mg by mouth every 6 (six) hours as needed for  pain.          Marland Kitchen sulfamethoxazole-trimethoprim (SEPTRA DS) 800-160 MG per tablet   Oral   Take 1 tablet by mouth every 12 (twelve) hours.   20 tablet   0    BP 127/62  Pulse 80  Temp(Src) 98.4 F (36.9 C) (Oral)  Resp 18  Ht 5\' 4"  (1.626 m)  Wt 240 lb (108.863 kg)  BMI 41.18 kg/m2  SpO2 99%  LMP 06/18/2013 Physical Exam  Nursing note and vitals reviewed. Constitutional: She is oriented to person, place, and time. She appears well-developed and well-nourished.  HENT:  Head: Normocephalic and atraumatic.  Eyes: Conjunctivae and EOM are normal.  Neck: Normal range of motion.  Cardiovascular: Normal rate.   Pulmonary/Chest: Effort normal.  Abdominal: She exhibits no distension.  Musculoskeletal: Normal range of motion.  No bony tenderness or deformity about the left foot  Neurological: She is alert and oriented to person, place, and time.  Skin: Skin is dry.  0.5 cm area of erythema on the dorsal aspect of the left foot near the web space of the fourth and fifth toes, no abscess, fluid collection, drainage, or foreign body noted  Psychiatric: She  has a normal mood and affect. Her behavior is normal. Judgment and thought content normal.    ED Course  Procedures (including critical care time) Labs Review Labs Reviewed - No data to display Imaging Review No results found.  EKG Interpretation   None       MDM   1. Insect bite    Patient with probable small insect bite, left foot. Will treat with Bactrim. Likely has a superficial infection from scratching at the wound. Wound check in 2 days if no improvement. Return precautions are given. Patient is stable and ready for discharge.    Roxy Horseman, PA-C 07/18/13 1905

## 2013-07-18 NOTE — ED Notes (Signed)
Pt c/o wound to left foot between 4th and 5th toes since Tuesday.

## 2013-07-20 ENCOUNTER — Emergency Department (HOSPITAL_COMMUNITY)
Admission: EM | Admit: 2013-07-20 | Discharge: 2013-07-20 | Disposition: A | Discharge status: Home / Self Care | Payer: Self-pay | Attending: Emergency Medicine | Admitting: Emergency Medicine

## 2013-07-20 ENCOUNTER — Emergency Department (HOSPITAL_COMMUNITY): Payer: Self-pay

## 2013-07-20 ENCOUNTER — Encounter (HOSPITAL_COMMUNITY): Payer: Self-pay | Admitting: Emergency Medicine

## 2013-07-20 DIAGNOSIS — F17200 Nicotine dependence, unspecified, uncomplicated: Secondary | ICD-10-CM | POA: Insufficient documentation

## 2013-07-20 DIAGNOSIS — Y929 Unspecified place or not applicable: Secondary | ICD-10-CM | POA: Insufficient documentation

## 2013-07-20 DIAGNOSIS — IMO0002 Reserved for concepts with insufficient information to code with codable children: Secondary | ICD-10-CM | POA: Insufficient documentation

## 2013-07-20 DIAGNOSIS — Z792 Long term (current) use of antibiotics: Secondary | ICD-10-CM | POA: Insufficient documentation

## 2013-07-20 DIAGNOSIS — Z791 Long term (current) use of non-steroidal anti-inflammatories (NSAID): Secondary | ICD-10-CM | POA: Insufficient documentation

## 2013-07-20 DIAGNOSIS — M792 Neuralgia and neuritis, unspecified: Secondary | ICD-10-CM

## 2013-07-20 DIAGNOSIS — R21 Rash and other nonspecific skin eruption: Secondary | ICD-10-CM | POA: Insufficient documentation

## 2013-07-20 DIAGNOSIS — Y939 Activity, unspecified: Secondary | ICD-10-CM | POA: Insufficient documentation

## 2013-07-20 MED ORDER — KETOROLAC TROMETHAMINE 60 MG/2ML IM SOLN
60.0000 mg | Freq: Once | INTRAMUSCULAR | Status: AC
  Administered 2013-07-20: 60 mg via INTRAMUSCULAR
  Filled 2013-07-20: qty 2

## 2013-07-20 MED ORDER — NAPROXEN 500 MG PO TABS: 500.0000 mg | ORAL_TABLET | Freq: Two times a day (BID) | ORAL | Status: DC

## 2013-07-20 NOTE — ED Provider Notes (Signed)
CSN: [id number]     Arrival date & time 07/20/13  8295 History   First MD Initiated Contact with Patient 07/20/13 [phone]     Chief Complaint  Patient presents with  . Insect Bite   (Consider location/radiation/quality/duration/timing/severity/associated sxs/prior Treatment) HPI Comments: Pt presents several days after experiencing acute onset of pain to the L foot between the 4th and 5th toes.  She is unsure what caused the pain but when she initially presented she postulated that she had been bitten by an insect / spider - she had some redness but no swelling or fevers, was started on bactrim but has had no improvement - in fact she states that despite no swelling, drainage or fevers she is having ongoing burning pain that is worse with palpation and ambulation.  She is not a diabetic.  The history is provided by the patient and medical records.    History reviewed. No pertinent past medical history. Past Surgical History  Procedure Laterality Date  . Cesarean section    . Cholecystectomy     No family history on file. History  Substance Use Topics  . Smoking status: Current Every Day Smoker  . Smokeless tobacco: Not on file  . Alcohol Use: No   OB History   Grav Para Term Preterm Abortions TAB SAB Ect Mult Living                 Review of Systems  Constitutional: Negative for fever.  Skin: Positive for rash.    Allergies  Codeine  Home Medications   Current Outpatient Rx  Name  Route  Sig  Dispense  Refill  . acetaminophen (TYLENOL) 500 MG tablet   Oral   Take 1,000 mg by mouth every 6 (six) hours as needed for mild pain.         Marland Kitchen azithromycin (ZITHROMAX Z-PAK) 250 MG tablet      Take two tablets on day one, then one tab qd days 2-5   6 tablet   0   . benzonatate (TESSALON) 200 MG capsule   Oral   Take 1 capsule (200 mg total) by mouth 3 (three) times daily as needed for cough. Swallow whole, do not chew   21 capsule   0   . ibuprofen (ADVIL,MOTRIN) 200  MG tablet   Oral   Take 800 mg by mouth every 6 (six) hours as needed for pain.          Marland Kitchen sulfamethoxazole-trimethoprim (SEPTRA DS) 800-160 MG per tablet   Oral   Take 1 tablet by mouth every 12 (twelve) hours.   20 tablet   0   . naproxen (NAPROSYN) 500 MG tablet   Oral   Take 1 tablet (500 mg total) by mouth 2 (two) times daily with a meal.   30 tablet   0    BP 130/77  Pulse 80  Temp(Src) 97.9 F (36.6 C) (Oral)  Resp 20  Ht 5\' 4"  (1.626 m)  Wt 240 lb (108.863 kg)  BMI 41.18 kg/m2  SpO2 99%  LMP 06/18/2013 Physical Exam  Constitutional: She appears well-developed and well-nourished. No distress.  HENT:  Head: Normocephalic and atraumatic.  Eyes: Conjunctivae are normal. No scleral icterus.  Pulmonary/Chest: Effort normal.  Musculoskeletal: Normal range of motion. She exhibits tenderness ( over dorsum of left foot rash at base of lateral webspace - central crust, purple skin, 1.5cm in diameter, no swleling, no draininage and no warmth.). She exhibits no edema.  Neurological: She  is alert. Coordination normal.  Skin: Skin is warm and dry. Rash noted.    ED Course  Procedures (including critical care time) Labs Review Labs Reviewed - No data to display Imaging Review Dg Foot Complete Left  07/20/2013   CLINICAL DATA:  Erythema on the dorsal aspect of the left foot near the webspace of the 4th and 5th toes. Pain.  EXAM: LEFT FOOT - COMPLETE 3+ VIEW  COMPARISON:  None.  FINDINGS: There is no evidence of fracture or dislocation. There is no evidence of arthropathy or other focal bone abnormality. Prominent Achilles and plantar calcaneal spurs. Soft tissues are unremarkable.  IMPRESSION: Negative.   Electronically Signed   By: Burman Nieves M.D.   On: 07/20/2013 05:17    EKG Interpretation   None       MDM   1. Neuropathic pain of foot, left    She has rash and pain which appears to be a neuropathic burning pain - this is likely toxin mediated - consider  brown recluse bite - she has no signs of infection - r/o FB with xray, otherise nsaids and home.  Will keep off feet for the next couple of days.  X-ray negative for foreign body or bony abnormalities.  Meds given in ED:  Medications  ketorolac (TORADOL) injection 60 mg (60 mg Intramuscular Given 07/20/13 0448)    New Prescriptions   NAPROXEN (NAPROSYN) 500 MG TABLET    Take 1 tablet (500 mg total) by mouth 2 (two) times daily with a meal.        Vida Roller, MD 07/20/13 [phone]

## 2013-07-20 NOTE — ED Notes (Signed)
Pt states she first noticed an area of redness and pain to left foot near pinky toe on Tuesday.  Pt states she was seen here on the 27th for same and started on antibiotics.  Pt states the area is worse tonight and more painful

## 2016-01-30 ENCOUNTER — Emergency Department (HOSPITAL_COMMUNITY)
Admission: EM | Admit: 2016-01-30 | Discharge: 2016-01-30 | Disposition: A | Discharge status: Home / Self Care | Payer: Self-pay | Attending: Emergency Medicine | Admitting: Emergency Medicine

## 2016-01-30 ENCOUNTER — Encounter (HOSPITAL_COMMUNITY): Payer: Self-pay | Admitting: Emergency Medicine

## 2016-01-30 DIAGNOSIS — J029 Acute pharyngitis, unspecified: Secondary | ICD-10-CM | POA: Insufficient documentation

## 2016-01-30 DIAGNOSIS — F17210 Nicotine dependence, cigarettes, uncomplicated: Secondary | ICD-10-CM | POA: Insufficient documentation

## 2016-01-30 LAB — RAPID STREP SCREEN (MED CTR MEBANE ONLY): Streptococcus, Group A Screen (Direct): NEGATIVE

## 2016-01-30 MED ORDER — AMOXICILLIN 500 MG PO CAPS: 500.0000 mg | ORAL_CAPSULE | Freq: Three times a day (TID) | ORAL | Status: DC

## 2016-01-30 NOTE — Discharge Instructions (Signed)
Please use amoxil three times daily. Use salt water gargles and Chloraseptic spray. Keep your distance from others. Wash hands frequently. Use tylenol and ibuprofen for fever, chills, or discomfort. Pharyngitis Pharyngitis is a sore throat (pharynx). There is redness, pain, and swelling of your throat. HOME CARE   Drink enough fluids to keep your pee (urine) clear or pale yellow.  Only take medicine as told by your doctor.  You may get sick again if you do not take medicine as told. Finish your medicines, even if you start to feel better.  Do not take aspirin.  Rest.  Rinse your mouth (gargle) with salt water ( tsp of salt per 1 qt of water) every 1-2 hours. This will help the pain.  If you are not at risk for choking, you can suck on hard candy or sore throat lozenges. GET HELP IF:  You have large, tender lumps on your neck.  You have a rash.  You cough up green, yellow-brown, or bloody spit. GET HELP RIGHT AWAY IF:   You have a stiff neck.  You drool or cannot swallow liquids.  You throw up (vomit) or are not able to keep medicine or liquids down.  You have very bad pain that does not go away with medicine.  You have problems breathing (not from a stuffy nose). MAKE SURE YOU:   Understand these instructions.  Will watch your condition.  Will get help right away if you are not doing well or get worse.   This information is not intended to replace advice given to you by your health care provider. Make sure you discuss any questions you have with your health care provider.   Document Released: 12/26/2007 Document Revised: 04/29/2013 Document Reviewed: 03/16/2013 Elsevier Interactive Patient Education [employer].

## 2016-01-30 NOTE — ED Notes (Signed)
Pt states she has had a bad sore throat with fever for the past 2 days.

## 2016-01-30 NOTE — ED Provider Notes (Signed)
CSN: [id number]     Arrival date & time 01/30/16  1457 History  By signing my name below, I, [name], attest that this documentation has been prepared under the direction and in the presence of Lily Kocher, PA-C. Electronically Signed: Dora [name], Scribe. 01/30/2016. 4:14 PM.   Chief Complaint  Patient presents with  . Sore Throat    The history is provided by the patient. No language interpreter was used.     HPI Comments: [name] is a 40 y.o. female who presents to the Emergency Department complaining of sudden onset, constant, severe, sore throat since yesterday. She reports that she woke up at 3 AM yesterday morning and had a sore throat; she ate ice cream with no relief. Pt reports associated intermittent fever and chills; she measured her temperature today at 100.8. She also notes that she saw white spots on the back of her throat today. She endorses sore throat exacerbation with swallowing. Pt has no known sick contacts with similar symptoms but notes she was at [employer] several days ago. She denies vomiting, diarrhea, cough, or any other associated symptoms.  History reviewed. No pertinent past medical history. Past Surgical History  Procedure Laterality Date  . Cesarean section    . Cholecystectomy     History reviewed. No pertinent family history. Social History  Substance Use Topics  . Smoking status: Current Every Day Smoker -- 0.50 packs/day    Types: Cigarettes  . Smokeless tobacco: None  . Alcohol Use: No   OB History    No data available     Review of Systems  Constitutional: Positive for fever and chills.  HENT: Positive for sore throat.   Respiratory: Negative for cough.   Gastrointestinal: Negative for vomiting and diarrhea.  All other systems reviewed and are negative.   Allergies  Codeine  Home Medications   Prior to Admission medications   Medication Sig Start Date End Date Taking? Authorizing Provider  acetaminophen (TYLENOL) 500  MG tablet Take 1,000 mg by mouth every 6 (six) hours as needed for mild pain.    Historical Provider, MD  azithromycin (ZITHROMAX Z-PAK) 250 MG tablet Take two tablets on day one, then one tab qd days 2-5 05/26/13   Tammy Triplett, PA-C  benzonatate (TESSALON) 200 MG capsule Take 1 capsule (200 mg total) by mouth 3 (three) times daily as needed for cough. Swallow whole, do not chew 05/26/13   Tammy Triplett, PA-C  ibuprofen (ADVIL,MOTRIN) 200 MG tablet Take 800 mg by mouth every 6 (six) hours as needed for pain.     Historical Provider, MD  naproxen (NAPROSYN) 500 MG tablet Take 1 tablet (500 mg total) by mouth 2 (two) times daily with a meal. 07/20/13   Noemi Chapel, MD  sulfamethoxazole-trimethoprim (SEPTRA DS) 800-160 MG per tablet Take 1 tablet by mouth every 12 (twelve) hours. 07/18/13   Montine Circle, PA-C   BP 129/73 mmHg  Pulse 70  Temp(Src) 98.3 F (36.8 C) (Oral)  Resp 16  Ht 5\' 4"  (1.626 m)  Wt 245 lb (111.131 kg)  BMI 42.03 kg/m2  SpO2 100%  LMP 01/25/2016 Physical Exam  Constitutional: She is oriented to person, place, and time. She appears well-developed and well-nourished. No distress.  HENT:  Head: Normocephalic and atraumatic.  Mouth/Throat: Uvula is midline.  Uvula midline but enlarged. Increased redness of the posterior pharynx. Airway is patent. Small area of exudate at the inferior portion of the right tonsil.  Eyes: Conjunctivae and EOM are  normal.  Neck: Neck supple. No tracheal deviation present.  Mild cervical lymphadenopathy.  Cardiovascular: Normal rate and regular rhythm.   Murmur heard. 2/6 systolic murmur.  Pulmonary/Chest: Effort normal. No respiratory distress.  Musculoskeletal: Normal range of motion.  Neurological: She is alert and oriented to person, place, and time.  Skin: Skin is warm and dry.  Psychiatric: She has a normal mood and affect. Her behavior is normal.  Nursing note and vitals reviewed.   ED Course  Procedures (including  critical care time)  DIAGNOSTIC STUDIES: Oxygen Saturation is 100% on RA, normal by my interpretation.    COORDINATION OF CARE: 4:14 PM Discussed treatment plan with pt at bedside and pt agreed to plan.  Labs Review Labs Reviewed  RAPID STREP SCREEN (NOT AT [facility])  CULTURE, GROUP A STREP [facility])    Imaging Review No results found. I have personally reviewed and evaluated these images and lab results as part of my medical decision-making.   EKG Interpretation None      MDM  Vital signs stable. Airway patent. Pt has hx of fever and chills and difficulty swallowing. Pt to use salt water gargles and wash hands frequently. Rx for amoxil given to the patient. She will return if any changes or problem.   Final diagnoses:  None    **I have reviewed nursing notes, vital signs, and all appropriate lab and imaging results for this patient.* **I personally performed the services described in this documentation, which was scribed in my presence. The recorded information has been reviewed and is accurate.Lily Kocher, PA-C 01/30/16 [place], MD 01/30/16 [phone]

## 2016-02-02 LAB — CULTURE, GROUP A STREP (THRC)

## 2016-03-13 ENCOUNTER — Encounter (HOSPITAL_COMMUNITY): Payer: Self-pay | Admitting: Emergency Medicine

## 2016-03-13 ENCOUNTER — Emergency Department (HOSPITAL_COMMUNITY)
Admission: EM | Admit: 2016-03-13 | Discharge: 2016-03-13 | Disposition: A | Discharge status: Home / Self Care | Payer: Medicaid Other | Attending: Emergency Medicine | Admitting: Emergency Medicine

## 2016-03-13 DIAGNOSIS — F17210 Nicotine dependence, cigarettes, uncomplicated: Secondary | ICD-10-CM | POA: Insufficient documentation

## 2016-03-13 DIAGNOSIS — K0510 Chronic gingivitis, plaque induced: Secondary | ICD-10-CM | POA: Insufficient documentation

## 2016-03-13 DIAGNOSIS — J029 Acute pharyngitis, unspecified: Secondary | ICD-10-CM | POA: Insufficient documentation

## 2016-03-13 MED ORDER — AZITHROMYCIN 250 MG PO TABS
500.0000 mg | ORAL_TABLET | Freq: Once | ORAL | Status: AC
  Administered 2016-03-13: 500 mg via ORAL
  Filled 2016-03-13: qty 2

## 2016-03-13 MED ORDER — MAGIC MOUTHWASH W/LIDOCAINE: 10.0000 mL | Freq: Four times a day (QID) | ORAL | 0 refills | Status: DC | PRN

## 2016-03-13 MED ORDER — AZITHROMYCIN 250 MG PO TABS: ORAL_TABLET | ORAL | 0 refills | Status: DC

## 2016-03-13 NOTE — ED Triage Notes (Signed)
Pt seen here approx 1 month ago for strep throat. Pt states she started having sinus congestion then sore throat. Pt reports she developed blisters in her mouth and throat. Pt was seen at Urgent Care on 8/20 and prescribed Levaquin and steroids. Pt could not afford Levaquin so med was changed to Bactrim. Pt has been taking meds x 2 days, states she is feeling no better.

## 2016-03-15 NOTE — ED Provider Notes (Signed)
[facility] DEPT Provider Note   CSN: [id number] Arrival date & time: 03/13/16  1904     History   Chief Complaint Chief Complaint  Patient presents with  . Sore Throat    HPI [name] is a 40 y.o. female presenting with a now 4 day history of moderate throat pain along with subjective fever and bilateral ear pain, worsened with swallowing. She was seen here last month and treated for presumptive strep throat with a 10 day course of amoxil which she competed and symptoms resolved until 4 days ago.  She was seen at an urgent care and started on bactrim, reporting she had a positive strep screen, but feels no better despite currently on day 3 of this abx.  She denies mouth or throat swelling, sob, cough, wheezing but endorses her gums are sore and swollen in places as well.   The history is provided by the patient.  Sore Throat  Pertinent negatives include no chest pain, no abdominal pain and no shortness of breath.    History reviewed. No pertinent past medical history.  There are no active problems to display for this patient.   Past Surgical History:  Procedure Laterality Date  . CESAREAN SECTION    . CHOLECYSTECTOMY      OB History    Gravida Para Term Preterm AB Living   3 3 2 1        SAB TAB Ectopic Multiple Live Births                   Home Medications    Prior to Admission medications   Medication Sig Start Date End Date Taking? Authorizing Provider  acetaminophen (TYLENOL) 500 MG tablet Take 1,000 mg by mouth every 6 (six) hours as needed for mild pain.    Historical Provider, MD  azithromycin (ZITHROMAX Z-PAK) 250 MG tablet 1 tab PO daily 03/14/16   Evalee Jefferson, PA-C  benzonatate (TESSALON) 200 MG capsule Take 1 capsule (200 mg total) by mouth 3 (three) times daily as needed for cough. Swallow whole, do not chew 05/26/13   Tammy Triplett, PA-C  ibuprofen (ADVIL,MOTRIN) 200 MG tablet Take 800 mg by mouth every 6 (six) hours as needed for pain.      Historical Provider, MD  magic mouthwash w/lidocaine SOLN Take 10 mLs by mouth 4 (four) times daily as needed (throat pain). Note to pharmacy - equal parts diphendydramine, aluminum hydroxide and lidocaine HCL 03/13/16   Evalee Jefferson, PA-C  naproxen (NAPROSYN) 500 MG tablet Take 1 tablet (500 mg total) by mouth 2 (two) times daily with a meal. 07/20/13   Noemi Chapel, MD    Family History Family History  Problem Relation Age of Onset  . Diabetes Mother   . Crohn's disease Mother   . Diabetes Sister   . Hypertension Sister   . Diabetes Other     Social History Social History  Substance Use Topics  . Smoking status: Current Every Day Smoker    Packs/day: 0.50    Types: Cigarettes  . Smokeless tobacco: Never Used  . Alcohol use No     Allergies   Codeine   Review of Systems Review of Systems  Constitutional: Positive for fever. Negative for chills.  HENT: Positive for sore throat. Negative for congestion, ear pain, rhinorrhea, sinus pressure, trouble swallowing and voice change.   Eyes: Negative for discharge.  Respiratory: Negative for cough, shortness of breath, wheezing and stridor.   Cardiovascular: Negative for chest pain.  Gastrointestinal: Negative for abdominal pain.  Genitourinary: Negative.      Physical Exam Updated Vital Signs BP 125/77 (BP Location: Right Arm)   Pulse 84   Temp 98.4 F (36.9 C) (Oral)   Resp 16   Ht 5\' 4"  (1.626 m)   Wt 104.3 kg   LMP 02/28/2016 (Approximate)   SpO2 98%   BMI 39.48 kg/m   Physical Exam  Constitutional: She is oriented to person, place, and time. She appears well-developed and well-nourished.  HENT:  Head: Normocephalic and atraumatic.  Right Ear: Tympanic membrane and ear canal normal.  Left Ear: Tympanic membrane and ear canal normal.  Nose: No mucosal edema or rhinorrhea.  Mouth/Throat: Uvula is midline and mucous membranes are normal. Oropharyngeal exudate and posterior oropharyngeal erythema present. No  posterior oropharyngeal edema or tonsillar abscesses.  Exudate on lower right tonsil. Bilateral erythema including posterior pharyngeal wall. Mild erythema along anterior gingiva.  Eyes: Conjunctivae are normal.  Cardiovascular: Normal rate and normal heart sounds.   Pulmonary/Chest: Effort normal. No respiratory distress. She has no wheezes. She has no rales.  Abdominal: Soft. There is no tenderness.  Musculoskeletal: Normal range of motion.  Neurological: She is alert and oriented to person, place, and time.  Skin: Skin is warm and dry. No rash noted.  Psychiatric: She has a normal mood and affect.     ED Treatments / Results  Labs (all labs ordered are listed, but only abnormal results are displayed) Labs Reviewed - No data to display  EKG  EKG Interpretation None       Radiology No results found.  Procedures Procedures (including critical care time)  Medications Ordered in ED Medications  azithromycin (ZITHROMAX) tablet 500 mg (500 mg Oral Given 03/13/16 2025)     Initial Impression / Assessment and Plan / ED Course  I have reviewed the triage vital signs and the nursing notes.  Pertinent labs & imaging results that were available during my care of the patient were reviewed by me and considered in my medical decision making (see chart for details).  Clinical Course    Strep positive per pt report, will switch to zpack given recent amoxl and bactrim not a good choice for GAS tx.  Prn f/u with pcp if sx persist or worsen. No suggestion of peritonsillar abscess.  Pt in no distress.  The patient appears reasonably screened and/or stabilized for discharge and I doubt any other medical condition or other [facility] requiring further screening, evaluation, or treatment in the ED at this time prior to discharge.   Final Clinical Impressions(s) / ED Diagnoses   Final diagnoses:  Pharyngitis  Gingivitis    New Prescriptions Discharge Medication List as of 03/13/2016  8:19 PM     START taking these medications   Details  magic mouthwash w/lidocaine SOLN Take 10 mLs by mouth 4 (four) times daily as needed (throat pain). Note to pharmacy - equal parts diphendydramine, aluminum hydroxide and lidocaine HCL, Starting Tue 03/13/2016, Print         Evalee Jefferson, PA-C 03/15/16 [place], PA-C 03/15/16 [place], MD 03/15/16 [phone]

## 2016-11-06 ENCOUNTER — Encounter (HOSPITAL_COMMUNITY): Payer: Self-pay | Admitting: Emergency Medicine

## 2016-11-06 ENCOUNTER — Emergency Department (HOSPITAL_COMMUNITY): Payer: Self-pay

## 2016-11-06 ENCOUNTER — Emergency Department (HOSPITAL_COMMUNITY)
Admission: EM | Admit: 2016-11-06 | Discharge: 2016-11-06 | Disposition: A | Discharge status: Home / Self Care | Payer: Self-pay | Attending: Emergency Medicine | Admitting: Emergency Medicine

## 2016-11-06 DIAGNOSIS — D259 Leiomyoma of uterus, unspecified: Secondary | ICD-10-CM | POA: Insufficient documentation

## 2016-11-06 DIAGNOSIS — R102 Pelvic and perineal pain: Secondary | ICD-10-CM

## 2016-11-06 DIAGNOSIS — F17210 Nicotine dependence, cigarettes, uncomplicated: Secondary | ICD-10-CM | POA: Insufficient documentation

## 2016-11-06 DIAGNOSIS — R109 Unspecified abdominal pain: Secondary | ICD-10-CM

## 2016-11-06 HISTORY — DX: Irritable bowel syndrome without diarrhea: K58.9

## 2016-11-06 LAB — COMPREHENSIVE METABOLIC PANEL
ALT: 23 U/L (ref 14–54)
ANION GAP: 11 (ref 5–15)
AST: 26 U/L (ref 15–41)
Albumin: 4.1 g/dL (ref 3.5–5.0)
Alkaline Phosphatase: 95 U/L (ref 38–126)
BUN: 10 mg/dL (ref 6–20)
CHLORIDE: 104 mmol/L (ref 101–111)
CO2: 23 mmol/L (ref 22–32)
Calcium: 9.4 mg/dL (ref 8.9–10.3)
Creatinine, Ser: 0.89 mg/dL (ref 0.44–1.00)
GFR calc Af Amer: >60 mL/min (ref >60)
GFR calc non Af Amer: >60 mL/min (ref >60)
GLUCOSE: 164 mg/dL — AB (ref 65–99)
POTASSIUM: 3.6 mmol/L (ref 3.5–5.1)
SODIUM: 138 mmol/L (ref 135–145)
Total Bilirubin: 0.3 mg/dL (ref 0.3–1.2)
Total Protein: 7.7 g/dL (ref 6.5–8.1)

## 2016-11-06 LAB — CBC WITH DIFFERENTIAL/PLATELET
BASOS ABS: 0 10*3/uL (ref 0.0–0.1)
BASOS PCT: 0 %
EOS ABS: 0.1 10*3/uL (ref 0.0–0.7)
Eosinophils Relative: 1 %
HCT: 46 % (ref 36.0–46.0)
Hemoglobin: 15.7 g/dL — ABNORMAL HIGH (ref 12.0–15.0)
Lymphocytes Relative: 34 %
Lymphs Abs: 3 10*3/uL (ref 0.7–4.0)
MCH: 30 pg (ref 26.0–34.0)
MCHC: 34.1 g/dL (ref 30.0–36.0)
MCV: 88 fL (ref 78.0–100.0)
Monocytes Absolute: 0.4 10*3/uL (ref 0.1–1.0)
Monocytes Relative: 4 %
Neutro Abs: 5.3 10*3/uL (ref 1.7–7.7)
Neutrophils Relative %: 61 %
Platelets: 287 10*3/uL (ref 150–400)
RBC: 5.23 MIL/uL — ABNORMAL HIGH (ref 3.87–5.11)
RDW: 14.2 % (ref 11.5–15.5)
WBC: 8.7 10*3/uL (ref 4.0–10.5)

## 2016-11-06 LAB — URINALYSIS, ROUTINE W REFLEX MICROSCOPIC
Bacteria, UA: NONE SEEN
Bilirubin Urine: NEGATIVE
Glucose, UA: NEGATIVE mg/dL
Ketones, ur: NEGATIVE mg/dL
Nitrite: NEGATIVE
Protein, ur: NEGATIVE mg/dL
Specific Gravity, Urine: 1.014 (ref 1.005–1.030)
pH: 6 (ref 5.0–8.0)

## 2016-11-06 LAB — POC URINE PREG, ED: Preg Test, Ur: NEGATIVE

## 2016-11-06 MED ORDER — DICYCLOMINE HCL 10 MG PO CAPS
10.0000 mg | ORAL_CAPSULE | Freq: Once | ORAL | Status: AC
  Administered 2016-11-06: 10 mg via ORAL
  Filled 2016-11-06: qty 1

## 2016-11-06 MED ORDER — IBUPROFEN 400 MG PO TABS
400.0000 mg | ORAL_TABLET | Freq: Once | ORAL | Status: AC
  Administered 2016-11-06: 400 mg via ORAL
  Filled 2016-11-06: qty 1

## 2016-11-06 NOTE — ED Notes (Addendum)
RN went into room to discharge patient and patient was not there. Pt left without discharge instructions. Pt has an IV that was not taken out. No IV in trash.  No vitals or signature. Called and left a message on pt's cell phone about returning

## 2016-11-06 NOTE — ED Notes (Signed)
ED Provider at bedside. 

## 2016-11-06 NOTE — ED Triage Notes (Signed)
Pt states abdominal cramping for over a week.  7/10 pain.  Pt states "it feels like im having contractions" Last menstrual period 10/28/16.

## 2016-11-06 NOTE — ED Provider Notes (Signed)
[facility] DEPT Provider Note   CSN: [id number] Arrival date & time: 11/06/16  1217   By signing my name below, I, Delton Prairie, attest that this documentation has been prepared under the direction and in the presence of Merrily Pew, MD  Electronically Signed: Delton Prairie, ED Scribe. 11/06/16. 1:15 PM.   History   Chief Complaint No chief complaint on file.   HPI Comments:  [name] is a 41 y.o. female, with a PMHx of IBS, who presents to the Emergency Department complaining of ongoing, intermittent episodes of lower pelvic pain every 15-30 minutes which radiates to her back x 10-11 days. She notes her episodes of pain last for minutes. She describes her pain as a cramping sensation. She also reports associated nausea during these episodes of pain and minimal vaginal spotting yesterday. Pt notes her last menstrual period stopped 4 days ago. She notes she had 5 bowel movement today which is normal for her due to IBS. Pt denies a hx of ovarian cysts, medication allergies or episodes of pain at night. She also denies vomiting, dysuria, vaginal discharge or any other associated symptoms. No other complaints noted.    The history is provided by the patient. No language interpreter was used.    Past Medical History:  Diagnosis Date  . IBS (irritable bowel syndrome) 1994    There are no active problems to display for this patient.   Past Surgical History:  Procedure Laterality Date  . CESAREAN SECTION    . CHOLECYSTECTOMY      OB History    Gravida Para Term Preterm AB Living   3 3 2 1        SAB TAB Ectopic Multiple Live Births                   Home Medications    Prior to Admission medications   Medication Sig Start Date End Date Taking? Authorizing Provider  acetaminophen (TYLENOL) 500 MG tablet Take 1,500 mg by mouth every 6 (six) hours as needed for mild pain.    Yes Historical Provider, MD  albuterol (PROVENTIL HFA;VENTOLIN HFA) 108 (90 Base) MCG/ACT  inhaler Inhale 1-2 puffs into the lungs every 6 (six) hours as needed for wheezing or shortness of breath.   Yes Historical Provider, MD  dicyclomine (BENTYL) 10 MG capsule Take 10 mg by mouth daily as needed for spasms.   Yes Historical Provider, MD  ibuprofen (ADVIL,MOTRIN) 200 MG tablet Take 800 mg by mouth every 6 (six) hours as needed for pain.    Yes Historical Provider, MD  ranitidine (ZANTAC) 150 MG tablet Take 150 mg by mouth daily as needed for heartburn.   Yes Historical Provider, MD    Family History Family History  Problem Relation Age of Onset  . Diabetes Mother   . Crohn's disease Mother   . Diabetes Sister   . Hypertension Sister   . Diabetes Other     Social History Social History  Substance Use Topics  . Smoking status: Current Every Day Smoker    Packs/day: 0.50    Types: Cigarettes  . Smokeless tobacco: Never Used  . Alcohol use No     Allergies   Codeine   Review of Systems Review of Systems  Gastrointestinal: Positive for nausea. Negative for vomiting.  Genitourinary: Positive for pelvic pain. Negative for dysuria and vaginal discharge.  All other systems reviewed and are negative.    Physical Exam Updated Vital Signs BP 127/89 (BP Location: Right  Arm)   Pulse 65   Temp 97.6 F (36.4 C) (Oral)   Resp 18   Ht 5\' 4"  (1.626 m)   Wt 236 lb (107 kg)   LMP 10/28/2016   SpO2 99%   BMI 40.51 kg/m   Physical Exam  Constitutional: She is oriented to person, place, and time. She appears well-developed and well-nourished. No distress.  HENT:  Head: Normocephalic and atraumatic.  Eyes: Conjunctivae are normal.  Neck: Normal range of motion.  Cardiovascular: Normal rate.   Pulmonary/Chest: Effort normal.  Abdominal: Soft. She exhibits no distension.  Musculoskeletal: Normal range of motion. She exhibits no edema.  Neurological: She is alert and oriented to person, place, and time.  Skin: Skin is warm and dry.  Psychiatric: She has a normal mood  and affect.  Nursing note and vitals reviewed.    ED Treatments / Results  DIAGNOSTIC STUDIES:  Oxygen Saturation is 99% on RA, normal by my interpretation.    COORDINATION OF CARE:  1:07 PM Discussed treatment plan with pt at bedside and pt agreed to plan.  Labs (all labs ordered are listed, but only abnormal results are displayed) Labs Reviewed  CBC WITH DIFFERENTIAL/PLATELET - Abnormal; Notable for the following:       Result Value   RBC 5.23 (*)    Hemoglobin 15.7 (*)    All other components within normal limits  COMPREHENSIVE METABOLIC PANEL - Abnormal; Notable for the following:    Glucose, Bld 164 (*)    All other components within normal limits  URINALYSIS, ROUTINE W REFLEX MICROSCOPIC - Abnormal; Notable for the following:    Hgb urine dipstick SMALL (*)    Leukocytes, UA TRACE (*)    Squamous Epithelial / LPF 0-5 (*)    All other components within normal limits  POC URINE PREG, ED    EKG  EKG Interpretation None       Radiology US Transvaginal Non-ob  Result Date: 11/06/2016 CLINICAL DATA:  Pelvic cramping 1 week.  IBS. EXAM: TRANSABDOMINAL AND TRANSVAGINAL ULTRASOUND OF PELVIS TECHNIQUE: Both transabdominal and transvaginal ultrasound examinations of the pelvis were performed. Transabdominal technique was performed for global imaging of the pelvis including uterus, ovaries, adnexal regions, and pelvic cul-de-sac. It was necessary to proceed with endovaginal exam following the transabdominal exam to visualize the endometrium and ovaries. COMPARISON:  CT 12/19/2010 and pelvic ultrasound 05/20/2015 FINDINGS: Uterus Measurements: 4.8 x 5.1 x 10.8 cm. Intramural mass over the anterior uterine body measuring 3 cm in greatest diameter likely intramural fibroid. There are a few small nabothian cysts. Endometrium Thickness: 13.7 mm.  No focal abnormality visualized. Right ovary Measurements: 3.9 x 2.9 x 5.4 cm. Two adjacent cystic structures within the right adnexa  measuring 1.4 and 2.9 cm in greatest diameter respectively which may represent dominant follicles versus paraovarian cysts. Left ovary Not visualized. Other findings No abnormal free fluid. IMPRESSION: Mildly prominent uterus with 3 cm intramural fibroid over the anterior uterine body. Two adjacent small cystic structures in the right adnexa which may represent dominant follicles versus paraovarian cysts. Left ovary not visualized. Electronically Signed   By: Marin Olp M.D.   On: 11/06/2016 14:36   US Pelvis Complete  Result Date: 11/06/2016 CLINICAL DATA:  Pelvic cramping 1 week.  IBS. EXAM: TRANSABDOMINAL AND TRANSVAGINAL ULTRASOUND OF PELVIS TECHNIQUE: Both transabdominal and transvaginal ultrasound examinations of the pelvis were performed. Transabdominal technique was performed for global imaging of the pelvis including uterus, ovaries, adnexal regions, and pelvic cul-de-sac. It  was necessary to proceed with endovaginal exam following the transabdominal exam to visualize the endometrium and ovaries. COMPARISON:  CT 12/19/2010 and pelvic ultrasound 05/20/2015 FINDINGS: Uterus Measurements: 4.8 x 5.1 x 10.8 cm. Intramural mass over the anterior uterine body measuring 3 cm in greatest diameter likely intramural fibroid. There are a few small nabothian cysts. Endometrium Thickness: 13.7 mm.  No focal abnormality visualized. Right ovary Measurements: 3.9 x 2.9 x 5.4 cm. Two adjacent cystic structures within the right adnexa measuring 1.4 and 2.9 cm in greatest diameter respectively which may represent dominant follicles versus paraovarian cysts. Left ovary Not visualized. Other findings No abnormal free fluid. IMPRESSION: Mildly prominent uterus with 3 cm intramural fibroid over the anterior uterine body. Two adjacent small cystic structures in the right adnexa which may represent dominant follicles versus paraovarian cysts. Left ovary not visualized. Electronically Signed   By: Marin Olp M.D.   On:  11/06/2016 14:36    Procedures Procedures (including critical care time)  Medications Ordered in ED Medications  dicyclomine (BENTYL) capsule 10 mg (10 mg Oral Given 11/06/16 1317)  ibuprofen (ADVIL,MOTRIN) tablet 400 mg (400 mg Oral Given 11/06/16 1317)     Initial Impression / Assessment and Plan / ED Course  I have reviewed the triage vital signs and the nursing notes.  Pertinent labs & imaging results that were available during my care of the patient were reviewed by me and considered in my medical decision making (see chart for details).     Her CT showed uterine fibroid but no other findings. Patient still with some crampy pain. Rest of workup negative. Unsure if cause of symptoms is related to fibroid or something else. She will follow-up with her gynecologist for further management and workup. Low suspicion for abdominal causes of this time.  Final Clinical Impressions(s) / ED Diagnoses   Final diagnoses:  Abdominal cramping  Uterine leiomyoma, unspecified location    New Prescriptions Discharge Medication List as of 11/06/2016  4:58 PM    I personally performed the services described in this documentation, which was scribed in my presence. The recorded information has been reviewed and is accurate.     Merrily Pew, MD 11/06/16 2024

## 2017-01-14 ENCOUNTER — Encounter (HOSPITAL_COMMUNITY): Payer: Self-pay | Admitting: *Deleted

## 2017-01-14 ENCOUNTER — Emergency Department (HOSPITAL_COMMUNITY)
Admission: EM | Admit: 2017-01-14 | Discharge: 2017-01-14 | Disposition: A | Discharge status: Home / Self Care | Payer: Self-pay | Attending: Emergency Medicine | Admitting: Emergency Medicine

## 2017-01-14 DIAGNOSIS — M5442 Lumbago with sciatica, left side: Secondary | ICD-10-CM | POA: Insufficient documentation

## 2017-01-14 DIAGNOSIS — Z79899 Other long term (current) drug therapy: Secondary | ICD-10-CM | POA: Insufficient documentation

## 2017-01-14 DIAGNOSIS — F17210 Nicotine dependence, cigarettes, uncomplicated: Secondary | ICD-10-CM | POA: Insufficient documentation

## 2017-01-14 DIAGNOSIS — G8929 Other chronic pain: Secondary | ICD-10-CM | POA: Insufficient documentation

## 2017-01-14 MED ORDER — HYDROMORPHONE HCL 1 MG/ML IJ SOLN
1.0000 mg | Freq: Once | INTRAMUSCULAR | Status: AC
  Administered 2017-01-14: 1 mg via INTRAMUSCULAR
  Filled 2017-01-14: qty 1

## 2017-01-14 MED ORDER — METHOCARBAMOL 500 MG PO TABS: 500.0000 mg | ORAL_TABLET | Freq: Three times a day (TID) | ORAL | 0 refills | Status: DC

## 2017-01-14 MED ORDER — PREDNISONE 10 MG PO TABS: ORAL_TABLET | ORAL | 0 refills | Status: DC

## 2017-01-14 NOTE — Discharge Instructions (Signed)
Apply ice packs on/off to your back.  Follow-up with one of the provider's listed.  You can also contact the case worker here with the hospital to inquire about assistance.

## 2017-01-14 NOTE — ED Notes (Signed)
Yesterday with lower back pain, known bulging discs per pt and has flare ups couple times a year.  Has taken 800 mg ibuprofen at 0700 this morning without relief.  Recently shampooed carpet and mowing yard on riding [employer], nature.

## 2017-01-14 NOTE — ED Triage Notes (Signed)
States this episode is worse than he usual flare ups

## 2017-01-14 NOTE — ED Triage Notes (Signed)
Back pain , has recurrent pain

## 2017-01-16 NOTE — ED Provider Notes (Signed)
[facility] DEPT Provider Note   CSN: [id number] Arrival date & time: 01/14/17  1006     History   Chief Complaint Chief Complaint  Patient presents with  . Back Pain    HPI [name] is a 41 y.o. female.  HPI   [name] is a 41 y.o. female who presents to the Emergency Department complaining of low back pain.  States this is a recurrent problem.  Describes a sharp pain to lower back and radiates into the left leg.  Pain associated with movement.  Improves with rest.  No relief from OTC pain medications.  She denies injury, abd pain, urine or bowel changes, numbness or weakness of the lower extremities.  Past Medical History:  Diagnosis Date  . IBS (irritable bowel syndrome) 1994    There are no active problems to display for this patient.   Past Surgical History:  Procedure Laterality Date  . CESAREAN SECTION    . CHOLECYSTECTOMY      OB History    Gravida Para Term Preterm AB Living   3 3 2 1        SAB TAB Ectopic Multiple Live Births                   Home Medications    Prior to Admission medications   Medication Sig Start Date End Date Taking? Authorizing Provider  acetaminophen (TYLENOL) 500 MG tablet Take 1,500 mg by mouth every 6 (six) hours as needed for mild pain.    Yes [provider]  albuterol (PROVENTIL HFA;VENTOLIN HFA) 108 (90 Base) MCG/ACT inhaler Inhale 1-2 puffs into the lungs every 6 (six) hours as needed for wheezing or shortness of breath.   Yes [provider]  dicyclomine (BENTYL) 10 MG capsule Take 10 mg by mouth daily as needed for spasms.   Yes [provider]  ibuprofen (ADVIL,MOTRIN) 200 MG tablet Take 800 mg by mouth every 6 (six) hours as needed for pain.    Yes [provider]  ranitidine (ZANTAC) 150 MG tablet Take 150 mg by mouth daily as needed for heartburn.   Yes [provider]  methocarbamol (ROBAXIN) 500 MG tablet Take 1 tablet (500 mg total) by mouth 3 (three)  times daily. 01/14/17   Philmore Lepore, PA-C  predniSONE (DELTASONE) 10 MG tablet Take 6 tablets day one, 5 tablets day two, 4 tablets day three, 3 tablets day four, 2 tablets day five, then 1 tablet day six 01/14/17   Kem Parkinson, PA-C    Family History Family History  Problem Relation Age of Onset  . Diabetes Mother   . Crohn's disease Mother   . Diabetes Sister   . Hypertension Sister   . Diabetes Other     Social History Social History  Substance Use Topics  . Smoking status: Current Every Day Smoker    Packs/day: 0.50    Types: Cigarettes  . Smokeless tobacco: Never Used  . Alcohol use No     Allergies   Codeine   Review of Systems Review of Systems  Constitutional: Negative for fever.  Respiratory: Negative for shortness of breath.   Gastrointestinal: Negative for abdominal pain, constipation and vomiting.  Genitourinary: Negative for decreased urine volume, difficulty urinating, dysuria, flank pain and hematuria.  Musculoskeletal: Positive for back pain. Negative for joint swelling.  Skin: Negative for rash.  Neurological: Negative for weakness and numbness.  All other systems reviewed and are negative.    Physical Exam  Updated Vital Signs BP 111/78   Pulse 66   Temp 97.7 F (36.5 C) (Oral)   Resp 20   Ht 5\' 3"  (1.6 m)   Wt 106.6 kg (235 lb)   LMP 01/02/2017   SpO2 96%   BMI 41.63 kg/m   Physical Exam  Constitutional: She is oriented to person, place, and time. She appears well-developed and well-nourished. No distress.  HENT:  Head: Normocephalic and atraumatic.  Neck: Normal range of motion. Neck supple.  Cardiovascular: Normal rate, regular rhythm, normal heart sounds and intact distal pulses.   No murmur heard. Pulmonary/Chest: Effort normal and breath sounds normal. No respiratory distress.  Abdominal: Soft. She exhibits no distension and no mass. There is no tenderness. There is no guarding.  Musculoskeletal: She exhibits tenderness.  She exhibits no edema.       Lumbar back: She exhibits tenderness and pain. She exhibits normal range of motion, no swelling, no deformity, no laceration and normal pulse.  ttp of the lower lumbar spine and left paraspinal muscles.  Positive SLR on left at 20 degrees.   Pt has 5/5 strength against resistance of bilateral lower extremities.     Neurological: She is alert and oriented to person, place, and time. She has normal strength. No sensory deficit. She exhibits normal muscle tone. Coordination and gait normal.  Reflex Scores:      Patellar reflexes are 2+ on the right side and 2+ on the left side.      Achilles reflexes are 2+ on the right side and 2+ on the left side. Skin: Skin is warm and dry. No rash noted.  Nursing note and vitals reviewed.    ED Treatments / Results  Labs (all labs ordered are listed, but only abnormal results are displayed) Labs Reviewed - No data to display  EKG  EKG Interpretation None       Radiology No results found.  Procedures Procedures (including critical care time)  Medications Ordered in ED Medications  HYDROmorphone (DILAUDID) injection 1 mg (1 mg Intramuscular Given 01/14/17 1158)     Initial Impression / Assessment and Plan / ED Course  I have reviewed the triage vital signs and the nursing notes.  Pertinent labs & imaging results that were available during my care of the patient were reviewed by me and considered in my medical decision making (see chart for details).     Pt with recurrent likely acute on chronic back pain.  No focal neuro deficits,  No concerning sx's for emergent neurological process.  Feeling better after medication.  Appears stable for d/c, agrees to PCP f/u.    Final Clinical Impressions(s) / ED Diagnoses   Final diagnoses:  Chronic midline low back pain with left-sided sciatica    New Prescriptions Discharge Medication List as of 01/14/2017 12:23 PM    START taking these medications   Details   methocarbamol (ROBAXIN) 500 MG tablet Take 1 tablet (500 mg total) by mouth 3 (three) times daily., Starting Mon 01/14/2017, Print    predniSONE (DELTASONE) 10 MG tablet Take 6 tablets day one, 5 tablets day two, 4 tablets day three, 3 tablets day four, 2 tablets day five, then 1 tablet day six, Print         Kem Parkinson, PA-C 01/16/17 1832    Milton Ferguson, MD 01/18/17 1015

## 2018-04-10 ENCOUNTER — Emergency Department (HOSPITAL_COMMUNITY): Payer: Medicaid Other

## 2018-04-10 ENCOUNTER — Encounter (HOSPITAL_COMMUNITY): Payer: Self-pay | Admitting: Emergency Medicine

## 2018-04-10 ENCOUNTER — Emergency Department (HOSPITAL_COMMUNITY)
Admission: EM | Admit: 2018-04-10 | Discharge: 2018-04-11 | Disposition: A | Discharge status: Home / Self Care | Payer: Medicaid Other | Attending: Emergency Medicine | Admitting: Emergency Medicine

## 2018-04-10 ENCOUNTER — Other Ambulatory Visit: Payer: Self-pay

## 2018-04-10 DIAGNOSIS — Z9049 Acquired absence of other specified parts of digestive tract: Secondary | ICD-10-CM | POA: Diagnosis not present

## 2018-04-10 DIAGNOSIS — R0789 Other chest pain: Secondary | ICD-10-CM | POA: Insufficient documentation

## 2018-04-10 DIAGNOSIS — F17210 Nicotine dependence, cigarettes, uncomplicated: Secondary | ICD-10-CM | POA: Diagnosis not present

## 2018-04-10 DIAGNOSIS — R0781 Pleurodynia: Secondary | ICD-10-CM

## 2018-04-10 MED ORDER — KETOROLAC TROMETHAMINE 15 MG/ML IJ SOLN
15.0000 mg | Freq: Once | INTRAMUSCULAR | Status: AC
  Administered 2018-04-11: 15 mg via INTRAMUSCULAR
  Filled 2018-04-10: qty 1

## 2018-04-10 NOTE — ED Triage Notes (Addendum)
Pt reports she fell on 9/1 when she was getting up from camping chair and she fell onto her R side. Pt reports pain to her ribcage and radiating to the R mid back. Pt denies LOC, hitting her head. Denies weakness, numbness.

## 2018-04-10 NOTE — ED Notes (Signed)
Patient transported to X-ray 

## 2018-04-11 LAB — POC URINE PREG, ED: PREG TEST UR: NEGATIVE

## 2018-04-11 NOTE — Discharge Instructions (Addendum)
Please return to the Emergency Department for any new or worsening symptoms or if your symptoms do not improve. Please be sure to follow up with your Primary Care Physician as soon as possible regarding your visit today. If you do not have a Primary Doctor please use the resources below to establish one. Please follow-up with your sports medicine doctor, Dr. Francesco Runner regarding your rib pain and visit today.  Your imaging today did not show any rib fractures on the right side.  ontact a health care provider if: You have increased bruising or swelling. You have pain that is not controlled with treatment. You have a fever. Get help right away if: You have difficulty breathing or shortness of breath. You develop a continual cough, or you cough up thick or bloody sputum. You feel sick to your stomach (nauseous), you throw up (vomit), or you have abdominal pain. Contact a health care provider if: You have a fever. Your chest pain becomes worse. You have new symptoms. Get help right away if: You have nausea or vomiting. You feel sweaty or light-headed. You have a cough with phlegm (sputum) or you cough up blood. You develop shortness of breath.  RESOURCE GUIDE  Chronic Pain Problems: Contact [facility]  [phone] Patients need to be referred by their primary care doctor.  Insufficient Money for Medicine: Contact [employer]:  call "211" or [place] [phone].  No Primary Care Doctor: Call [facility]  [phone] - can help you locate a primary care doctor that  accepts your insurance, provides certain services, etc. Physician Referral Service- [phone]  Agencies that provide inexpensive medical care: [facility]  [facility]  [phone] [facility]  [phone] [facility]  [phone] [facility]  [phone] [facility]  [phone]  [place]  Providers: [facility]- [address], [address]  [phone], Mon-Fri 9am-7pm, Sat 9am-1pm [place], [address] [place]  [place], [address] [place]  [place]- [address]  [place], [address], [phone]  Only accepts [place] patients after they have their name  applied to their card  Self Pay (no insurance) in [facility]: Sickle Cell Patients: Dr Kevan Ny, [facility]  [place], [facility]- [place]  [facility] [place]- [address], [facility]- see information above (Speak to [employer] if you do not have insurance)       -  [facility]- [place], [place],  [place], [place]  Dr Vista Lawman-  [address], [place], [facility]- [address], [phone]       -  [facility]- [address], [place], also [address] [phone]       -    [facility]-  [address] [place], 1st & 3rd Saturday   every month, 10am-1pm  1) Find a Doctor and Pay Out of Pocket Although you won't have to find out who is covered by your insurance plan, it is a good idea to ask around and get recommendations. You will then need to call the office and see if the doctor you have chosen will accept you as a new patient and what types of options they offer for patients who are self-pay. Some doctors offer discounts or will set up payment plans for their patients who do not have insurance, but you will need to ask so you aren't surprised when you get to your appointment.  2) Contact Your  Local Health Department Not all health departments have doctors that can see patients for sick visits, but many do, so it is worth a call to see if yours does. If you don't know where your local health department is, you can check in your phone book. The CDC also has a tool to help you locate your state's health department, and many state websites also have listings of all of their local health departments.  3) Find a [facility] If your illness is not likely to be very severe or complicated, you may want to try a walk in clinic. These are popping up all over the country in pharmacies, drugstores, and shopping centers. They're usually staffed by nurse practitioners or physician assistants that have been trained to treat common illnesses and complaints. They're usually fairly quick and inexpensive. However, if you have serious medical issues or chronic medical problems, these are probably not your best option  [facility] [place], [facility], [address] [place], phone [phone] or [phone].  Monday - Friday, call for an appointment. [place], [facility], [address], [place], phone [phone] or [phone].  Monday - Friday, call for an appointment.  Abuse/Neglect: [place] [phone] [place] [phone] (After Hours)  Emergency Shelter:  [facility] [phone]  Maternity Homes: Room at the [place] [phone] [place] [phone]  MRSA Hotline #:   281 651 2326  [facility] Dept. [address]         Short Phone:  [phone]                                   Phone:  [phone]                   Phone:  [place], [phone]       -  [facility] in [place], [address],                                  [place] [phone] or [phone] (After Hours)   [place]  Substance Abuse Resources: Alcohol and Drug Services  [place] [phone] [facility] [phone] Residential & Outpatient Substance Abuse Program  [phone]  Psychological Services: [place]  [phone] [place]  [place], [place] [address], [place], [place]: [phone] or [phone], [employer]  Dental Assistance  If unable to pay or uninsured, contact:  [facility] or [facility]. to become qualified for the adult dental clinic.  Patients with Medicaid: [facility] [phone] [address], [place] [address] [phone]  If unable to pay, or uninsured, contact [facility] ([phone])[phone]) or [place] [phone] in [place], [place] in [facility]) to become qualified for the adult dental clinic   Other [place]- [place], [place], [place], [address], [place], [place], 2nd and 4th Thursday of the month at 6:30am.  10 clients each day by appointment, can sometimes see walk-in patients if someone does not show for an appointment. [facility]- [address] [place]ard Danker [place], [place], [address], [place], [place], [place], [address], [place] Department- [phone] [place] [facility] [phone]

## 2018-04-11 NOTE — ED Provider Notes (Signed)
[facility] EMERGENCY DEPARTMENT Provider Note   CSN: [id number] Arrival date & time: 04/10/18  2125     History   Chief Complaint Chief Complaint  Patient presents with  . Fall    HPI [name] is a 42 y.o. female presenting for right-sided rib pain that began on 03/23/2018 after falling out of a camping chair.  Patient states that when she fell out of the camper chair she landed on her right side, describes pain as immediate onset throbbing pain moderate in intensity and made worse with palpation to the area.  Patient states that she was seen and evaluated at her sports medicine doctor, Dr. Francesco Runner on 9/13 for continued pain.  At that time patient received "trigger point injections "and had mild decrease in pain.  Patient presenting at this time requesting x-rays to evaluate for rib fractures on the right side.  Patient states that she is still having mild 3/10 in severity pain made worse with palpation to the right posterior ribs.  Patient states that she is taking ibuprofen and muscle relaxers for her pain with minimal relief.  Patient states that pain is improved with reaching her right arm strain to the area and leaning to the left side.  Patient denies fever, chest pain, shortness of breath, numbness/tingling, weakness, headache, neck pain, loss of consciousness, bowel or bladder incontinence or saddle area paresthesias.  HPI  Past Medical History:  Diagnosis Date  . IBS (irritable bowel syndrome) 1994    There are no active problems to display for this patient.   Past Surgical History:  Procedure Laterality Date  . CESAREAN SECTION    . CHOLECYSTECTOMY       OB History    Gravida  3   Para  3   Term  2   Preterm  1   AB      Living        SAB      TAB      Ectopic      Multiple      Live Births               Home Medications    Prior to Admission medications   Medication Sig Start Date End Date Taking? Authorizing  Provider  acetaminophen (TYLENOL) 500 MG tablet Take 1,500 mg by mouth every 6 (six) hours as needed for mild pain.     [provider]  albuterol (PROVENTIL HFA;VENTOLIN HFA) 108 (90 Base) MCG/ACT inhaler Inhale 1-2 puffs into the lungs every 6 (six) hours as needed for wheezing or shortness of breath.    [provider]  dicyclomine (BENTYL) 10 MG capsule Take 10 mg by mouth daily as needed for spasms.    [provider]  ibuprofen (ADVIL,MOTRIN) 200 MG tablet Take 800 mg by mouth every 6 (six) hours as needed for pain.     [provider]  methocarbamol (ROBAXIN) 500 MG tablet Take 1 tablet (500 mg total) by mouth 3 (three) times daily. 01/14/17   Triplett, Tammy, PA-C  predniSONE (DELTASONE) 10 MG tablet Take 6 tablets day one, 5 tablets day two, 4 tablets day three, 3 tablets day four, 2 tablets day five, then 1 tablet day six 01/14/17   Triplett, Tammy, PA-C  ranitidine (ZANTAC) 150 MG tablet Take 150 mg by mouth daily as needed for heartburn.    [provider]    Family History Family History  Problem Relation Age of Onset  .  Diabetes Mother   . Crohn's disease Mother   . Diabetes Sister   . Hypertension Sister   . Diabetes Other     Social History Social History   Tobacco Use  . Smoking status: Current Every Day Smoker    Packs/day: 0.50    Types: Cigarettes  . Smokeless tobacco: Never Used  Substance Use Topics  . Alcohol use: No  . Drug use: Yes    Types: Marijuana     Allergies   Codeine   Review of Systems Review of Systems  Constitutional: Negative.  Negative for chills, fatigue and fever.  Respiratory: Negative.  Negative for shortness of breath.   Cardiovascular: Negative.  Negative for chest pain.  Gastrointestinal: Negative.  Negative for abdominal pain, nausea and vomiting.  Musculoskeletal: Positive for arthralgias. Negative for neck pain.  Skin: Negative.  Negative for color change and wound.  Neurological:  Negative.  Negative for dizziness, syncope, weakness, light-headedness, numbness and headaches.     Physical Exam Updated Vital Signs BP 114/72 (BP Location: Right Arm)   Pulse 74   Temp 98.3 F (36.8 C) (Oral)   Resp 18   LMP 03/08/2018   SpO2 98%   Physical Exam  Constitutional: She is oriented to person, place, and time. She appears well-developed and well-nourished. No distress.  HENT:  Head: Normocephalic and atraumatic.  Right Ear: External ear normal.  Left Ear: External ear normal.  Nose: Nose normal.  Eyes: Pupils are equal, round, and reactive to light. EOM are normal.  Neck: Trachea normal and normal range of motion. No tracheal deviation present.  Pulmonary/Chest: Effort normal and breath sounds normal. No accessory muscle usage. No respiratory distress. She has no decreased breath sounds.     She exhibits tenderness. She exhibits no crepitus, no edema, no deformity and no swelling.  Tenderness to palpation of the right posterior lower ribs.  No bruising or deformity noted.  Abdominal: Soft. There is no tenderness. There is no rebound and no guarding.  Musculoskeletal: Normal range of motion.       Right shoulder: Normal. She exhibits normal range of motion, no tenderness, no bony tenderness and no deformity.       Left shoulder: Normal.       Cervical back: Normal. She exhibits no tenderness, no bony tenderness, no swelling and no deformity.       Thoracic back: Normal. She exhibits no bony tenderness, no swelling, no edema and no deformity.       Lumbar back: Normal.  No midline spinal tenderness to palpation, no paraspinal muscle tenderness, no deformity, crepitus, or step-off noted.   Neurological: She is alert and oriented to person, place, and time. She has normal strength. No sensory deficit. GCS eye subscore is 4. GCS verbal subscore is 5. GCS motor subscore is 6.  Speech is clear and goal oriented, follows commands Major Cranial nerves without deficit, no  facial droop Normal strength in upper and lower extremities bilaterally including dorsiflexion and plantar flexion, strong and equal grip strength Sensation normal to light and sharp touch Moves extremities without ataxia, coordination intact Normal gait  Skin: Skin is warm and dry. No erythema.  Psychiatric: She has a normal mood and affect. Her behavior is normal.   ED Treatments / Results  Labs (all labs ordered are listed, but only abnormal results are displayed) Labs Reviewed  POC URINE PREG, ED    EKG None  Radiology Dg Ribs Unilateral W/chest Right  Result Date: 04/11/2018  CLINICAL DATA:  Right posterior rib pain after a fall 3 weeks ago. EXAM: RIGHT RIBS AND CHEST - 3+ VIEW COMPARISON:  None. FINDINGS: Normal heart size and pulmonary vascularity. No focal airspace disease or consolidation in the lungs. No blunting of costophrenic angles. No pneumothorax. Mediastinal contours appear intact. Right ribs appear intact. No acute displaced fractures identified. No focal bone lesion or bone destruction. Bone cortex appears intact. Soft tissues are unremarkable. IMPRESSION: No evidence of active pulmonary disease.  Negative right ribs. Electronically Signed   By: Lucienne Capers M.D.   On: 04/11/2018 00:24    Procedures Procedures (including critical care time)  Medications Ordered in ED Medications  ketorolac (TORADOL) 15 MG/ML injection 15 mg (15 mg Intramuscular Given 04/11/18 0009)     Initial Impression / Assessment and Plan / ED Course  I have reviewed the triage vital signs and the nursing notes.  Pertinent labs & imaging results that were available during my care of the patient were reviewed by me and considered in my medical decision making (see chart for details).    Patient presenting with 18 days of right posterior rib pain after fall from the chair.  Imaging today: IMPRESSION: No evidence of active pulmonary disease.  Negative right ribs. No sign of injury or  infection on examination today.  Patient with tenderness to palpation of the right posterior ribs, improved with reaching arm over head.  Patient given 15 mg Toradol shot today and states slight improvement of pain.  Patient denies history of gastric bleeding or kidney disease.  Patient has been seen and evaluated by her sports medicine doctor and states that she is able to follow-up with Dr. Francesco Runner tomorrow morning for further evaluation.  Patient is afebrile, not tachycardic, not hypotensive, well-appearing and in no acute distress today.  Patient without neuro deficits on examination, 5/5 strength to upper and lower extremities.  Ambulatory without difficulty or distress.  Patient informed to use rest, ice and stretching to help with the pain.  At this time there does not appear to be any evidence of an acute emergency medical condition and the patient appears stable for discharge with appropriate outpatient follow up. Diagnosis was discussed with patient who verbalizes understanding of care plan and is agreeable to discharge. I have discussed return precautions with patient who verbalizes understanding of return precautions. Patient strongly encouraged to follow-up with their PCP and sports medicine. All questions answered.   Note: Portions of this report may have been transcribed using voice recognition software. Every effort was made to ensure accuracy; however, inadvertent computerized transcription errors may still be present.  Final Clinical Impressions(s) / ED Diagnoses   Final diagnoses:  Rib pain on right side    ED Discharge Orders    None       Gari Crown 04/11/18 0128    Drenda Freeze, MD 04/11/18 [phone]

## 2020-01-08 ENCOUNTER — Other Ambulatory Visit: Payer: Self-pay

## 2020-01-08 ENCOUNTER — Emergency Department (HOSPITAL_COMMUNITY)
Admission: EM | Admit: 2020-01-08 | Discharge: 2020-01-08 | Disposition: A | Discharge status: Home / Self Care | Payer: Medicaid Other | Attending: Emergency Medicine | Admitting: Emergency Medicine

## 2020-01-08 ENCOUNTER — Encounter (HOSPITAL_COMMUNITY): Payer: Self-pay | Admitting: *Deleted

## 2020-01-08 DIAGNOSIS — K625 Hemorrhage of anus and rectum: Secondary | ICD-10-CM | POA: Insufficient documentation

## 2020-01-08 DIAGNOSIS — E669 Obesity, unspecified: Secondary | ICD-10-CM | POA: Insufficient documentation

## 2020-01-08 DIAGNOSIS — Z885 Allergy status to narcotic agent status: Secondary | ICD-10-CM | POA: Insufficient documentation

## 2020-01-08 DIAGNOSIS — Z79899 Other long term (current) drug therapy: Secondary | ICD-10-CM | POA: Insufficient documentation

## 2020-01-08 DIAGNOSIS — F17210 Nicotine dependence, cigarettes, uncomplicated: Secondary | ICD-10-CM | POA: Insufficient documentation

## 2020-01-08 DIAGNOSIS — R1033 Periumbilical pain: Secondary | ICD-10-CM | POA: Insufficient documentation

## 2020-01-08 LAB — CBC WITH DIFFERENTIAL/PLATELET
Abs Immature Granulocytes: 0.01 10*3/uL (ref 0.00–0.07)
Basophils Absolute: 0.1 10*3/uL (ref 0.0–0.1)
Basophils Relative: 1 %
Eosinophils Absolute: 0.2 10*3/uL (ref 0.0–0.5)
Eosinophils Relative: 2 %
HCT: 45.7 % (ref 36.0–46.0)
Hemoglobin: 15.1 g/dL — ABNORMAL HIGH (ref 12.0–15.0)
Immature Granulocytes: 0 %
Lymphocytes Relative: 57 %
Lymphs Abs: 5.6 10*3/uL — ABNORMAL HIGH (ref 0.7–4.0)
MCH: 30 pg (ref 26.0–34.0)
MCHC: 33 g/dL (ref 30.0–36.0)
MCV: 90.9 fL (ref 80.0–100.0)
Monocytes Absolute: 0.5 10*3/uL (ref 0.1–1.0)
Monocytes Relative: 5 %
Neutro Abs: 3.4 10*3/uL (ref 1.7–7.7)
Neutrophils Relative %: 35 %
Platelets: 281 10*3/uL (ref 150–400)
RBC: 5.03 MIL/uL (ref 3.87–5.11)
RDW: 12.8 % (ref 11.5–15.5)
WBC: 10.2 10*3/uL (ref 4.0–10.5)
nRBC: 0 % (ref 0.0–0.2)

## 2020-01-08 LAB — HEPATIC FUNCTION PANEL
ALT: 34 U/L (ref 0–44)
AST: 24 U/L (ref 15–41)
Albumin: 4.3 g/dL (ref 3.5–5.0)
Alkaline Phosphatase: 109 U/L (ref 38–126)
Bilirubin, Direct: 0.1 mg/dL (ref 0.0–0.2)
Indirect Bilirubin: 0.3 mg/dL (ref 0.3–0.9)
Total Bilirubin: 0.4 mg/dL (ref 0.3–1.2)
Total Protein: 7.9 g/dL (ref 6.5–8.1)

## 2020-01-08 LAB — URINALYSIS, ROUTINE W REFLEX MICROSCOPIC
Bilirubin Urine: NEGATIVE
Glucose, UA: NEGATIVE mg/dL
Hgb urine dipstick: NEGATIVE
Ketones, ur: NEGATIVE mg/dL
Leukocytes,Ua: NEGATIVE
Nitrite: NEGATIVE
Protein, ur: NEGATIVE mg/dL
Specific Gravity, Urine: 1.014 (ref 1.005–1.030)
pH: 7 (ref 5.0–8.0)

## 2020-01-08 LAB — BASIC METABOLIC PANEL
Anion gap: 11 (ref 5–15)
BUN: 10 mg/dL (ref 6–20)
CO2: 27 mmol/L (ref 22–32)
Calcium: 9.4 mg/dL (ref 8.9–10.3)
Chloride: 99 mmol/L (ref 98–111)
Creatinine, Ser: 0.88 mg/dL (ref 0.44–1.00)
GFR calc Af Amer: >60 mL/min (ref >60)
GFR calc non Af Amer: >60 mL/min (ref >60)
Glucose, Bld: 102 mg/dL — ABNORMAL HIGH (ref 70–99)
Potassium: 3.9 mmol/L (ref 3.5–5.1)
Sodium: 137 mmol/L (ref 135–145)

## 2020-01-08 LAB — LIPASE, BLOOD: Lipase: 34 U/L (ref 11–51)

## 2020-01-08 LAB — HCG, QUANTITATIVE, PREGNANCY: hCG, Beta Chain, Quant, S: 5 m[IU]/mL — ABNORMAL HIGH (ref <5)

## 2020-01-08 LAB — POC OCCULT BLOOD, ED: Fecal Occult Bld: NEGATIVE

## 2020-01-08 MED ORDER — DICYCLOMINE HCL 10 MG PO CAPS: 10.0000 mg | ORAL_CAPSULE | Freq: Every day | ORAL | 0 refills | Status: AC | PRN

## 2020-01-08 NOTE — Discharge Instructions (Signed)
Your labs are reassuring today.  Call and follow-up closely with GI specialist for further management which may include a colonoscopy.  Take Bentyl as needed for abdominal spasm.

## 2020-01-08 NOTE — ED Provider Notes (Signed)
[facility] EMERGENCY DEPARTMENT Provider Note   CSN: [id number] Arrival date & time: 01/08/20  1733     History Chief Complaint  Patient presents with  . Abdominal Pain  . Rectal Bleeding    [name] is a 44 y.o. female.  The history is provided by the patient. No language interpreter was used.  Abdominal Pain Associated symptoms: hematochezia   Rectal Bleeding Associated symptoms: abdominal pain      44 year old female with history of irritable bowel syndrome presenting for evaluation of abdominal discomfort.  Patient report for the past 4 days she has had intermittent discomfort to her periumbilical region.  She described as more of an "achy sensation".  She also noticed some constipation yesterday but today she noticed loose stools mixed with blood and mucus which concerns her.  She does not complain of any fever chills no lightheadedness or dizziness no nausea vomiting no dysuria or hematuria.  She is in perimenopausal and her last menstrual period was more than a year ago.  She denies any recent travel or eating exotic food.  No recent sick contact.  She has not had a COVID-19 vaccination.  She does report that her mother has Crohn's disease.  She has an appetite but afraid to eat thinking that it may worsen her symptoms.  She feels her abdomen is bloated.  Past Medical History:  Diagnosis Date  . IBS (irritable bowel syndrome) 1994    There are no problems to display for this patient.   Past Surgical History:  Procedure Laterality Date  . CESAREAN SECTION    . CHOLECYSTECTOMY       OB History    Gravida  3   Para  3   Term  2   Preterm  1   AB      Living        SAB      TAB      Ectopic      Multiple      Live Births              Family History  Problem Relation Age of Onset  . Diabetes Mother   . Crohn's disease Mother   . Diabetes Sister   . Hypertension Sister   . Diabetes Other     Social History   Tobacco Use  .  Smoking status: Current Every Day Smoker    Packs/day: 0.50    Types: Cigarettes  . Smokeless tobacco: Never Used  Substance Use Topics  . Alcohol use: No  . Drug use: Yes    Types: Marijuana    Home Medications Prior to Admission medications   Medication Sig Start Date End Date Taking? Authorizing Provider  acetaminophen (TYLENOL) 500 MG tablet Take 1,500 mg by mouth every 6 (six) hours as needed for mild pain.     [provider]  albuterol (PROVENTIL HFA;VENTOLIN HFA) 108 (90 Base) MCG/ACT inhaler Inhale 1-2 puffs into the lungs every 6 (six) hours as needed for wheezing or shortness of breath.    [provider]  dicyclomine (BENTYL) 10 MG capsule Take 10 mg by mouth daily as needed for spasms.    [provider]  ibuprofen (ADVIL,MOTRIN) 200 MG tablet Take 800 mg by mouth every 6 (six) hours as needed for pain.     [provider]  methocarbamol (ROBAXIN) 500 MG tablet Take 1 tablet (500 mg total) by mouth 3 (three) times daily. 01/14/17   Kem Parkinson, PA-C  predniSONE (DELTASONE) 10 MG tablet Take 6 tablets day one, 5 tablets day two, 4 tablets day three, 3 tablets day four, 2 tablets day five, then 1 tablet day six 01/14/17   Triplett, Tammy, PA-C  ranitidine (ZANTAC) 150 MG tablet Take 150 mg by mouth daily as needed for heartburn.    [provider]    Allergies    Codeine  Review of Systems   Review of Systems  Gastrointestinal: Positive for abdominal pain and hematochezia.  All other systems reviewed and are negative.   Physical Exam Updated Vital Signs BP (!) 141/77   Pulse 87   Temp 99.1 F (37.3 C) (Oral)   Resp 18   Ht 5\' 4"  (1.626 m)   Wt 122.5 kg   LMP  (LMP Unknown)   SpO2 99%   BMI 46.35 kg/m   Physical Exam Vitals and nursing note reviewed.  Constitutional:      General: She is not in acute distress.    Appearance: She is well-developed. She is obese.  HENT:     Head: Atraumatic.  Eyes:      Conjunctiva/sclera: Conjunctivae normal.  Cardiovascular:     Rate and Rhythm: Normal rate and regular rhythm.  Pulmonary:     Effort: Pulmonary effort is normal.     Breath sounds: Normal breath sounds.  Abdominal:     General: Abdomen is flat. Bowel sounds are normal.     Palpations: Abdomen is soft.     Tenderness: There is abdominal tenderness (Mild periumbilical tenderness without guarding or rebound tenderness.  Negative Murphy sign, no pain at McBurney's point.) in the periumbilical area. There is no guarding or rebound. Negative signs include Murphy's sign, Rovsing's sign and McBurney's sign.     Hernia: No hernia is present.  Genitourinary:    Rectum: Guaiac result negative.     Comments: Chaperone present during exam.  Normal external genitalia.  No significant discomfort with digital rectal exam.  Internal hemorrhoid palpated.  No obvious mass.  Normal color stool on glove.  Hemoccult negative. Musculoskeletal:     Cervical back: Neck supple.  Skin:    Findings: No rash.  Neurological:     Mental Status: She is alert.     ED Results / Procedures / Treatments   Labs (all labs ordered are listed, but only abnormal results are displayed) Labs Reviewed  CBC WITH DIFFERENTIAL/PLATELET - Abnormal; Notable for the following components:      Result Value   Hemoglobin 15.1 (*)    Lymphs Abs 5.6 (*)    All other components within normal limits  BASIC METABOLIC PANEL - Abnormal; Notable for the following components:   Glucose, Bld 102 (*)    All other components within normal limits  HCG, QUANTITATIVE, PREGNANCY - Abnormal; Notable for the following components:   hCG, Beta Chain, Quant, S 5 (*)    All other components within normal limits  URINALYSIS, ROUTINE W REFLEX MICROSCOPIC  HEPATIC FUNCTION PANEL  LIPASE, BLOOD  POC OCCULT BLOOD, ED    EKG None  Radiology No results found.  Procedures Procedures (including critical care time)  Medications Ordered in  ED Medications - No data to display  ED Course  I have reviewed the triage vital signs and the nursing notes.  Pertinent labs & imaging results that were available during my care of the patient were reviewed by me and considered in my medical decision making (see chart for details).    MDM Rules/Calculators/A&P  BP (!) 141/77   Pulse 87   Temp 99.1 F (37.3 C) (Oral)   Resp 18   Ht 5\' 4"  (1.626 m)   Wt 122.5 kg   LMP  (LMP Unknown)   SpO2 99%   BMI 46.35 kg/m   Final Clinical Impression(s) / ED Diagnoses Final diagnoses:  Rectal bleeding    Rx / DC Orders ED Discharge Orders         Ordered    dicyclomine (BENTYL) 10 MG capsule  Daily PRN     Discontinue  Reprint     01/08/20 2143         8:19 PM Patient complaining of having loose stools with blood mixed with stool and mucus along with some mild abdominal discomfort.  She has a very benign abdominal exam.  Rectal exam unremarkable, fecal occult blood test negative.  Labs are reassuring, no leukocytosis, no electrolyte derangement, UA without signs of urinary tract infection.  At this time, encourage patient to follow-up outpatient with GI specialist for further management which may include a colonoscopy for further evaluation of GI bleed.  She does have a family history of Crohn's disease.  Bleeding may include hemorrhoidal bleed, AVM, malignancy, diverticular disease, inflammatory bowel disease however my suspicion for acute emergent medical condition is low at this time.  Patient voiced understanding and agrees with plan.   Domenic Moras, PA-C 01/08/20 2143    Daleen Bo, MD 01/10/20 1030

## 2020-01-08 NOTE — ED Triage Notes (Signed)
Intermittent rectal bleeding for over a week, abdominal pain and bloating onset today

## 2020-05-02 ENCOUNTER — Other Ambulatory Visit: Payer: Self-pay

## 2020-05-02 ENCOUNTER — Encounter (HOSPITAL_COMMUNITY): Payer: Self-pay | Admitting: *Deleted

## 2020-05-02 ENCOUNTER — Emergency Department (HOSPITAL_COMMUNITY): Payer: Medicaid - Out of State

## 2020-05-02 ENCOUNTER — Emergency Department (HOSPITAL_COMMUNITY)
Admission: EM | Admit: 2020-05-02 | Discharge: 2020-05-02 | Disposition: A | Discharge status: Home / Self Care | Payer: Medicaid - Out of State | Attending: Emergency Medicine | Admitting: Emergency Medicine

## 2020-05-02 DIAGNOSIS — M62838 Other muscle spasm: Secondary | ICD-10-CM

## 2020-05-02 DIAGNOSIS — R519 Headache, unspecified: Secondary | ICD-10-CM | POA: Diagnosis not present

## 2020-05-02 DIAGNOSIS — X58XXXA Exposure to other specified factors, initial encounter: Secondary | ICD-10-CM | POA: Insufficient documentation

## 2020-05-02 DIAGNOSIS — F17210 Nicotine dependence, cigarettes, uncomplicated: Secondary | ICD-10-CM | POA: Diagnosis not present

## 2020-05-02 DIAGNOSIS — S161XXA Strain of muscle, fascia and tendon at neck level, initial encounter: Secondary | ICD-10-CM | POA: Diagnosis not present

## 2020-05-02 DIAGNOSIS — S199XXA Unspecified injury of neck, initial encounter: Secondary | ICD-10-CM | POA: Diagnosis present

## 2020-05-02 MED ORDER — DIAZEPAM 5 MG PO TABS
5.0000 mg | ORAL_TABLET | Freq: Once | ORAL | Status: AC
  Administered 2020-05-02: 5 mg via ORAL
  Filled 2020-05-02: qty 1

## 2020-05-02 MED ORDER — BACLOFEN 10 MG PO TABS
10.0000 mg | ORAL_TABLET | Freq: Three times a day (TID) | ORAL | 0 refills | Status: AC
Start: 2020-05-02 — End: ?

## 2020-05-02 MED ORDER — KETOROLAC TROMETHAMINE 60 MG/2ML IM SOLN
60.0000 mg | Freq: Once | INTRAMUSCULAR | Status: AC
Start: 2020-05-02 — End: 2020-05-02
  Administered 2020-05-02: 60 mg via INTRAMUSCULAR
  Filled 2020-05-02: qty 2

## 2020-05-02 MED ORDER — DICLOFENAC SODIUM 75 MG PO TBEC: 75.0000 mg | DELAYED_RELEASE_TABLET | Freq: Two times a day (BID) | ORAL | 0 refills | Status: DC

## 2020-05-02 NOTE — ED Triage Notes (Signed)
Neck pain onset 09/25/ 2021. Also c/o headache

## 2020-05-02 NOTE — Discharge Instructions (Signed)
The CT scan of your cervical spine shows a right sided lymph node that will need close follow-up by your primary care provider.  This is likely secondary to your recent cat bite.  Your primary care provider may want to do an ultrasound in 1 month for recheck of this lymph node.  The pain of your neck is likely related to a strain of the muscles of your neck and possibly degenerative changes.  This should improve over time.  Take the medication as directed.  You may continue to alternate with ice and heat to your neck.  Avoid taking any additional muscle relaxers, Advil, ibuprofen, aspirin, or Aleve while taking the medication prescribed today.  You may contact the orthopedic provider listed, Dr. Aline Brochure to arrange a follow-up appointment.

## 2020-05-02 NOTE — ED Notes (Signed)
Gone to CT

## 2020-05-02 NOTE — ED Notes (Signed)
Pt had been put on Steroids and zpack for cat stratch fever and has tried steroids from friends 3 seperate times after prescription was empty for steroids for neck pain. Pt has no insurance and is paying cash.

## 2020-05-03 NOTE — ED Provider Notes (Signed)
[facility] EMERGENCY DEPARTMENT Provider Note   CSN: [id number] Arrival date & time: 05/02/20  1050     History Chief Complaint  Patient presents with   Neck [name]    [name] is a 44 y.o. female.  HPI     [name] is a 44 y.o. female who presents to the Emergency Department complaining of left sided neck [name] and headache.  Symptoms have been present for 2 weeks.  She states that she was seen at a urgent care 2 weeks ago for a cat bite to her right forearm and recalls the neck [name] beginning around that time.  She was treated with steroids and antibiotics.  The bite has improved, but neck [name] remains unchanged.  She went to another urgent care and treated with additional steroids and she admits to taking a family member's prednisone as well.  Headache is described as throbbing sensation to her forehead and [name] of her left neck is associated with movement and raising the left arm.  States that she had a x-ray of her neck that showed some spurring of her lower cervical spine.  She has occasional sharp pains to her neck as well.  She has been having difficulty sleeping due to the [name] and states that she is having to "lift" her head off the pillow with her right hand.  She denies numbness and [name] to her left arm or hand, nausea vomiting, visual changes, dizziness and chest [name].     Past Medical History:  Diagnosis Date   IBS (irritable bowel syndrome) 1994    There are no problems to display for this patient.   Past Surgical History:  Procedure Laterality Date   CESAREAN SECTION     CHOLECYSTECTOMY       OB History    Gravida  3   Para  3   Term  2   Preterm  1   AB      Living        SAB      TAB      Ectopic      Multiple      Live Births              Family History  Problem Relation Age of Onset   Diabetes Mother    Crohn's disease Mother    Diabetes Sister    Hypertension Sister    Diabetes Other     Social History    Tobacco Use   Smoking status: Current Every Day Smoker    Packs/day: 0.50    Types: Cigarettes   Smokeless tobacco: Never Used  Substance Use Topics   Alcohol use: No   Drug use: Yes    Types: Marijuana    Home Medications Prior to Admission medications   Medication Sig Start Date End Date Taking? Authorizing Provider  acetaminophen (TYLENOL) 500 MG tablet Take 1,500 mg by mouth every 6 (six) hours as needed for mild [name].     [provider]  albuterol (PROVENTIL HFA;VENTOLIN HFA) 108 (90 Base) MCG/ACT inhaler Inhale 1-2 puffs into the lungs every 6 (six) hours as needed for wheezing or shortness of breath.    [provider]  baclofen (LIORESAL) 10 MG tablet Take 1 tablet (10 mg total) by mouth 3 (three) times daily. 05/02/20   Donna Veal, PA-C  diclofenac (VOLTAREN) 75 MG EC tablet Take 1 tablet (75 mg total) by mouth 2 (two) times daily. Take with food 05/02/20  Antwanette Wesche, PA-C  dicyclomine (BENTYL) 10 MG capsule Take 1 capsule (10 mg total) by mouth daily as needed for spasms. 01/08/20   Domenic Moras, PA-C  ranitidine (ZANTAC) 150 MG tablet Take 150 mg by mouth daily as needed for heartburn.    [provider]    Allergies    Codeine  Review of Systems   Review of Systems  Constitutional: Negative for chills, fatigue and fever.  HENT: Negative for sore throat and trouble swallowing.   Respiratory: Negative for shortness of breath.   Cardiovascular: Negative for chest [name].  Gastrointestinal: Negative for abdominal [name], nausea and vomiting.  Genitourinary: Negative for dysuria, flank [name] and hematuria.  Musculoskeletal: Positive for neck [name]. Negative for arthralgias, back [name], myalgias and neck stiffness.  Skin: Negative for rash.  Neurological: Negative for dizziness, weakness and numbness.  Hematological: Does not bruise/bleed easily.    Physical Exam Updated Vital Signs BP (!) 147/87    Pulse 74    Temp 98.6 F (37 C)  (Oral)    Resp 20    SpO2 100%   Physical Exam Vitals and nursing note reviewed.  Constitutional:      General: She is not in acute distress.    Appearance: She is well-developed.  HENT:     Head: Normocephalic and atraumatic.  Eyes:     Pupils: Pupils are equal, round, and reactive to light.  Neck:     Thyroid: No thyromegaly.     Trachea: Phonation normal. No tracheal deviation.     Meningeal: Brudzinski's sign and Kernig's sign absent.  Cardiovascular:     Rate and Rhythm: Normal rate and regular rhythm.     Comments: Radial pulses are strong and palpable bilaterally Pulmonary:     Effort: Pulmonary effort is normal. No respiratory distress.     Breath sounds: Normal breath sounds.  Chest:     Chest wall: No tenderness.  Musculoskeletal:        General: Tenderness present.     Cervical back: Tenderness present. No swelling, deformity, erythema, rigidity, spasms or bony tenderness. Muscular tenderness present. No spinous process tenderness. Decreased range of motion.     Comments: ttp of the lower cervical spine and left cervical paraspinal muscles extends to left trapezius muscles. No edema.  Lymphadenopathy:     Cervical: No cervical adenopathy.  Skin:    General: Skin is warm.     Capillary Refill: Capillary refill takes less than 2 seconds.     Findings: No rash.  Neurological:     General: No focal deficit present.     Mental Status: She is alert and oriented to person, place, and time.     Sensory: Sensation is intact. No sensory deficit.     Motor: Motor function is intact. No weakness or abnormal muscle tone.     Coordination: Coordination is intact. Coordination normal.     Deep Tendon Reflexes:     Reflex Scores:      Tricep reflexes are 2+ on the right side and 2+ on the left side.      Bicep reflexes are 2+ on the right side and 2+ on the left side.    Comments: CN III-XII grossly intact     ED Results / Procedures / Treatments   Labs (all labs ordered  are listed, but only abnormal results are displayed) Labs Reviewed - No data to display  EKG None  Radiology CT Cervical Spine Wo Contrast  Result Date: 05/02/2020  CLINICAL DATA:  Chronic neck [name] EXAM: CT CERVICAL SPINE WITHOUT CONTRAST TECHNIQUE: Multidetector CT imaging of the cervical spine was performed without intravenous contrast. Multiplanar CT image reconstructions were also generated. COMPARISON:  X-ray 04/25/2004 FINDINGS: Alignment: Facet joints are aligned without dislocation or traumatic listhesis. Dens and lateral masses are aligned. Straightening of the cervical lordosis. Skull base and vertebrae: No acute fracture. No primary bone lesion or focal pathologic process. Soft tissues and spinal canal: No prevertebral fluid or swelling. No visible canal hematoma. Disc levels: Intervertebral disc heights are preserved. No significant facet joint arthropathy. No evidence of stenosis by CT. Upper chest: Visualized lung apices are clear. Other: There is a mildly enlarged right level 2A lymph node measuring 10 mm short axis (series 4, image 46) which is slightly more hyperdense than would be expected measuring approximately 94 HU. IMPRESSION: 1. No acute fracture or traumatic listhesis of the cervical spine. 2. Mildly enlarged and slightly hyperdense right level 2A lymph node, nonspecific. Suggest short-term follow-up with ultrasound in 4-6 weeks to assess for interval change. Electronically Signed   By: Davina Poke D.O.   On: 05/02/2020 13:47    Procedures Procedures (including critical care time)  Medications Ordered in ED Medications  ketorolac (TORADOL) injection 60 mg (60 mg Intramuscular Given 05/02/20 1310)  diazepam (VALIUM) tablet 5 mg (5 mg Oral Given 05/02/20 1306)    ED Course  I have reviewed the triage vital signs and the nursing notes.  Pertinent labs & imaging results that were available during my care of the patient were reviewed by me and considered in my  medical decision making (see chart for details).    MDM Rules/Calculators/A&P                          Pt with left sided headache and neck [name].  Neck [name] is reproducible with palpation.  No known injury.  NV intact.  Headache gradual in onset without meningeal signs.  No extremity weakness on exam.  CT C spine shows right-sided hyperdense lymph node likely secondary to a recent cat bite.  I doubt this is a source of her left-sided neck [name].  Exact origin of her neck [name] is unclear, but I feel she is likely having spasms of her neck and symptoms seem to be musculoskeletal.  On recheck, patient reports feeling better after medication given here, sleeping when I entered the room, easily aroused.  She agrees to treatment plan and close orthopedic follow-up   Return precautions discussed.  Final Clinical Impression(s) / ED Diagnoses Final diagnoses:  Muscle spasms of neck  Acute strain of neck muscle, initial encounter    Rx / DC Orders ED Discharge Orders         Ordered    baclofen (LIORESAL) 10 MG tablet  3 times daily        05/02/20 1421    diclofenac (VOLTAREN) 75 MG EC tablet  2 times daily        05/02/20 [address] [place], PA-C 05/03/20 1835    Truddie Hidden, MD 05/04/20 [phone]

## 2020-05-10 ENCOUNTER — Inpatient Hospital Stay (HOSPITAL_COMMUNITY)
Admission: EM | Admit: 2020-05-10 | Discharge: 2020-05-12 | DRG: 864 | Disposition: A | Discharge status: Home / Self Care | Payer: Medicaid - Out of State | Attending: Internal Medicine | Admitting: Internal Medicine

## 2020-05-10 ENCOUNTER — Encounter (HOSPITAL_COMMUNITY): Payer: Self-pay | Admitting: *Deleted

## 2020-05-10 ENCOUNTER — Emergency Department (HOSPITAL_COMMUNITY): Payer: Medicaid - Out of State

## 2020-05-10 ENCOUNTER — Other Ambulatory Visit: Payer: Self-pay

## 2020-05-10 DIAGNOSIS — L732 Hidradenitis suppurativa: Secondary | ICD-10-CM | POA: Diagnosis present

## 2020-05-10 DIAGNOSIS — R509 Fever, unspecified: Principal | ICD-10-CM | POA: Diagnosis present

## 2020-05-10 DIAGNOSIS — M7918 Myalgia, other site: Secondary | ICD-10-CM | POA: Diagnosis present

## 2020-05-10 DIAGNOSIS — L0292 Furuncle, unspecified: Secondary | ICD-10-CM | POA: Diagnosis present

## 2020-05-10 DIAGNOSIS — Z833 Family history of diabetes mellitus: Secondary | ICD-10-CM

## 2020-05-10 DIAGNOSIS — W5501XD Bitten by cat, subsequent encounter: Secondary | ICD-10-CM

## 2020-05-10 DIAGNOSIS — R519 Headache, unspecified: Secondary | ICD-10-CM | POA: Diagnosis present

## 2020-05-10 DIAGNOSIS — K589 Irritable bowel syndrome without diarrhea: Secondary | ICD-10-CM | POA: Diagnosis present

## 2020-05-10 DIAGNOSIS — M542 Cervicalgia: Secondary | ICD-10-CM | POA: Diagnosis present

## 2020-05-10 DIAGNOSIS — F17210 Nicotine dependence, cigarettes, uncomplicated: Secondary | ICD-10-CM | POA: Diagnosis present

## 2020-05-10 DIAGNOSIS — Z20822 Contact with and (suspected) exposure to covid-19: Secondary | ICD-10-CM | POA: Diagnosis present

## 2020-05-10 DIAGNOSIS — B379 Candidiasis, unspecified: Secondary | ICD-10-CM | POA: Diagnosis present

## 2020-05-10 DIAGNOSIS — R739 Hyperglycemia, unspecified: Secondary | ICD-10-CM | POA: Diagnosis present

## 2020-05-10 DIAGNOSIS — Z6841 Body Mass Index (BMI) 40.0 and over, adult: Secondary | ICD-10-CM

## 2020-05-10 DIAGNOSIS — Z8249 Family history of ischemic heart disease and other diseases of the circulatory system: Secondary | ICD-10-CM

## 2020-05-10 DIAGNOSIS — S41151D Open bite of right upper arm, subsequent encounter: Secondary | ICD-10-CM

## 2020-05-10 LAB — URINALYSIS, ROUTINE W REFLEX MICROSCOPIC
Bilirubin Urine: NEGATIVE
Glucose, UA: NEGATIVE mg/dL
Ketones, ur: NEGATIVE mg/dL
Leukocytes,Ua: NEGATIVE
Nitrite: NEGATIVE
Specific Gravity, Urine: 1.025 (ref 1.005–1.030)
pH: 6 (ref 5.0–8.0)

## 2020-05-10 LAB — CBC WITH DIFFERENTIAL/PLATELET
Abs Immature Granulocytes: 0.05 10*3/uL (ref 0.00–0.07)
Basophils Absolute: 0.1 10*3/uL (ref 0.0–0.1)
Basophils Relative: 1 %
Eosinophils Absolute: 0.2 10*3/uL (ref 0.0–0.5)
Eosinophils Relative: 2 %
HCT: 42.3 % (ref 36.0–46.0)
Hemoglobin: 14.1 g/dL (ref 12.0–15.0)
Immature Granulocytes: 1 %
Lymphocytes Relative: 28 %
Lymphs Abs: 2.3 10*3/uL (ref 0.7–4.0)
MCH: 30.4 pg (ref 26.0–34.0)
MCHC: 33.3 g/dL (ref 30.0–36.0)
MCV: 91.2 fL (ref 80.0–100.0)
Monocytes Absolute: 0.9 10*3/uL (ref 0.1–1.0)
Monocytes Relative: 10 %
Neutro Abs: 4.8 10*3/uL (ref 1.7–7.7)
Neutrophils Relative %: 58 %
Platelets: 365 10*3/uL (ref 150–400)
RBC: 4.64 MIL/uL (ref 3.87–5.11)
RDW: 14.2 % (ref 11.5–15.5)
WBC: 8.3 10*3/uL (ref 4.0–10.5)
nRBC: 0 % (ref 0.0–0.2)

## 2020-05-10 LAB — HEPATIC FUNCTION PANEL
ALT: 24 U/L (ref 0–44)
AST: 13 U/L — ABNORMAL LOW (ref 15–41)
Albumin: 3.6 g/dL (ref 3.5–5.0)
Alkaline Phosphatase: 99 U/L (ref 38–126)
Bilirubin, Direct: 0.1 mg/dL (ref 0.0–0.2)
Indirect Bilirubin: 0.2 mg/dL — ABNORMAL LOW (ref 0.3–0.9)
Total Bilirubin: 0.3 mg/dL (ref 0.3–1.2)
Total Protein: 7.4 g/dL (ref 6.5–8.1)

## 2020-05-10 LAB — BASIC METABOLIC PANEL WITH GFR
Anion gap: 10 (ref 5–15)
BUN: 12 mg/dL (ref 6–20)
CO2: 25 mmol/L (ref 22–32)
Calcium: 8.8 mg/dL — ABNORMAL LOW (ref 8.9–10.3)
Chloride: 102 mmol/L (ref 98–111)
Creatinine, Ser: 0.83 mg/dL (ref 0.44–1.00)
GFR, Estimated: >60 mL/min
Glucose, Bld: 153 mg/dL — ABNORMAL HIGH (ref 70–99)
Potassium: 3.1 mmol/L — ABNORMAL LOW (ref 3.5–5.1)
Sodium: 137 mmol/L (ref 135–145)

## 2020-05-10 LAB — RESPIRATORY PANEL BY RT PCR (FLU A&B, COVID)
Influenza A by PCR: NEGATIVE
Influenza B by PCR: NEGATIVE
SARS Coronavirus 2 by RT PCR: NEGATIVE

## 2020-05-10 LAB — LACTIC ACID, PLASMA: Lactic Acid, Venous: 1.8 mmol/L (ref 0.5–1.9)

## 2020-05-10 LAB — URINALYSIS, MICROSCOPIC (REFLEX)

## 2020-05-10 LAB — PROTIME-INR
INR: 1 (ref 0.8–1.2)
Prothrombin Time: 12.3 s (ref 11.4–15.2)

## 2020-05-10 MED ORDER — METOCLOPRAMIDE HCL 5 MG/ML IJ SOLN
10.0000 mg | Freq: Once | INTRAMUSCULAR | Status: AC | PRN
  Administered 2020-05-10: 10 mg via INTRAVENOUS
  Filled 2020-05-10: qty 2

## 2020-05-10 MED ORDER — DEXAMETHASONE SODIUM PHOSPHATE 10 MG/ML IJ SOLN
10.0000 mg | Freq: Once | INTRAMUSCULAR | Status: AC
  Administered 2020-05-10: 10 mg via INTRAVENOUS
  Filled 2020-05-10: qty 1

## 2020-05-10 MED ORDER — DIPHENHYDRAMINE HCL 50 MG/ML IJ SOLN
25.0000 mg | Freq: Once | INTRAMUSCULAR | Status: AC | PRN
  Administered 2020-05-10: 25 mg via INTRAVENOUS
  Filled 2020-05-10: qty 1

## 2020-05-10 MED ORDER — SODIUM CHLORIDE 0.9 % IV BOLUS
500.0000 mL | Freq: Once | INTRAVENOUS | Status: AC
  Administered 2020-05-10: 500 mL via INTRAVENOUS

## 2020-05-10 MED ORDER — VANCOMYCIN HCL IN DEXTROSE 1-5 GM/200ML-% IV SOLN: 1000.0000 mg | Freq: Once | INTRAVENOUS | Status: DC

## 2020-05-10 MED ORDER — KETOROLAC TROMETHAMINE 30 MG/ML IJ SOLN
30.0000 mg | Freq: Once | INTRAMUSCULAR | Status: AC | PRN
  Administered 2020-05-10: 30 mg via INTRAVENOUS
  Filled 2020-05-10: qty 1

## 2020-05-10 MED ORDER — VANCOMYCIN HCL IN DEXTROSE 1-5 GM/200ML-% IV SOLN
1000.0000 mg | Freq: Three times a day (TID) | INTRAVENOUS | Status: DC
  Administered 2020-05-11 – 2020-05-12 (×4): 1000 mg via INTRAVENOUS
  Filled 2020-05-10 (×4): qty 200

## 2020-05-10 MED ORDER — MORPHINE SULFATE (PF) 4 MG/ML IV SOLN
4.0000 mg | Freq: Once | INTRAVENOUS | Status: AC
  Administered 2020-05-10: 4 mg via INTRAVENOUS
  Filled 2020-05-10: qty 1

## 2020-05-10 MED ORDER — SODIUM CHLORIDE 0.9 % IV SOLN
2.0000 g | Freq: Once | INTRAVENOUS | Status: AC
  Administered 2020-05-10: 2 g via INTRAVENOUS
  Filled 2020-05-10: qty 20

## 2020-05-10 MED ORDER — SODIUM CHLORIDE 0.9 % IV SOLN
2.0000 g | Freq: Two times a day (BID) | INTRAVENOUS | Status: DC
  Administered 2020-05-11 – 2020-05-12 (×3): 2 g via INTRAVENOUS
  Filled 2020-05-10 (×3): qty 20

## 2020-05-10 MED ORDER — VANCOMYCIN HCL 2000 MG/400ML IV SOLN
2000.0000 mg | Freq: Once | INTRAVENOUS | Status: AC
  Administered 2020-05-10: 2000 mg via INTRAVENOUS
  Filled 2020-05-10: qty 400

## 2020-05-10 MED ORDER — ONDANSETRON HCL 4 MG/2ML IJ SOLN
4.0000 mg | Freq: Once | INTRAMUSCULAR | Status: AC
  Administered 2020-05-10: 4 mg via INTRAVENOUS
  Filled 2020-05-10: qty 2

## 2020-05-10 MED ORDER — ACETAMINOPHEN 500 MG PO TABS
1000.0000 mg | ORAL_TABLET | Freq: Once | ORAL | Status: AC
  Administered 2020-05-10: 1000 mg via ORAL
  Filled 2020-05-10: qty 2

## 2020-05-10 NOTE — Progress Notes (Signed)
Pharmacy Antibiotic Note  [name] is a 44 y.o. female admitted on 05/10/2020 with meningitis.  Pharmacy has been consulted for Vancomycin dosing.  Plan: Ceftriaxone 2 grams iv Q 12 hours Vancomycin 2 grams iv x 1 dose now then 1 gram iv Q 8 hours Follow Scr, cultures, progress  Height: 5\' 4"  (162.6 cm) Weight: 115.7 kg (255 lb) IBW/kg (Calculated) : 54.7  Temp (24hrs), Avg:100.7 F (38.2 C), Min:100.7 F (38.2 C), Max:100.7 F (38.2 C)  Recent Labs  Lab 05/10/20 1909 05/10/20 1918  WBC 8.3  --   CREATININE 0.83  --   LATICACIDVEN  --  1.8    Estimated Creatinine Clearance: 106.9 mL/min (by C-G formula based on SCr of 0.83 mg/dL).    Allergies  Allergen Reactions  . Codeine Itching     Thank you for allowing pharmacy to be a part of this patient's care.  Tad Moore 05/10/2020 10:03 PM

## 2020-05-10 NOTE — ED Triage Notes (Signed)
Pt c/o posterior neck pain with fevers, diarrhea and "bumps" to all over that are painful per pt. Ongoing for 3-4 weeks.  Pt seen [facility] in [place], sent to ER to r/o meningitis.

## 2020-05-10 NOTE — ED Provider Notes (Signed)
Emergency Department Provider Note   I have reviewed the triage vital signs and the nursing notes.   HISTORY  Chief Complaint Neck Pain   HPI [name] is a 44 y.o. female with past medical history reviewed below returns to the emergency department with continued headache and neck discomfort. She has since developed a diffuse rash and some diarrhea along with fever. Patient states that symptoms began  after a cat bite to her right arm and then her left arm several weeks ago.  She did not develop fever, however, into the last several days.  She is developed headache with neck pain and has been seen multiple times both in this emergency department as well as twice at urgent care.  After the initial Bite she was started on 5 days of azithromycin and is also been on steroid and pain medications for presumed neck spasms.  She notices a rash developing in her arms and legs.  She denies any respiratory symptoms which is cough, congestion, shortness of breath.  She not experiencing chest pain.  She again returned to urgent care and was referred to the emergency department for meningitis evaluation.  Past Medical History:  Diagnosis Date  . IBS (irritable bowel syndrome) 1994    Patient Active Problem List   Diagnosis Date Noted  . Fever 05/11/2020  . Fever in adult 05/10/2020    Past Surgical History:  Procedure Laterality Date  . CESAREAN SECTION    . CHOLECYSTECTOMY      Allergies Codeine  Family History  Problem Relation Age of Onset  . Diabetes Mother   . Crohn's disease Mother   . Diabetes Sister   . Hypertension Sister   . Diabetes Other     Social History Social History   Tobacco Use  . Smoking status: Current Every Day Smoker    Packs/day: 0.50    Types: Cigarettes  . Smokeless tobacco: Never Used  Substance Use Topics  . Alcohol use: No  . Drug use: Yes    Types: Marijuana    Review of Systems  Constitutional: Positive fever/chills Eyes: No visual  changes. ENT: No sore throat. Cardiovascular: Denies chest pain. Respiratory: Denies shortness of breath. Gastrointestinal: No abdominal pain.  No nausea, no vomiting.  No diarrhea.  No constipation. Genitourinary: Negative for dysuria. Musculoskeletal: Negative for back pain. Positive neck pain. Skin: Diffuse rash.  Neurological: Negative for focal weakness or numbness. Positive HA.   10-point ROS otherwise negative.  ____________________________________________   PHYSICAL EXAM:  VITAL SIGNS: ED Triage Vitals  Enc Vitals Group     BP 05/10/20 1848 (!) 168/83     Pulse Rate 05/10/20 1848 (!) 101     Resp 05/10/20 1848 16     Temp 05/10/20 1848 (!) 100.7 F (38.2 C)     Temp Source 05/10/20 1848 Oral     SpO2 05/10/20 1848 100 %     Weight 05/10/20 1849 255 lb (115.7 kg)     Height 05/10/20 1849 5\' 4"  (1.626 m)   Constitutional: Alert and oriented. Well appearing and in no acute distress. Eyes: Conjunctivae are normal Head: Atraumatic. Nose: No congestion/rhinnorhea. Mouth/Throat: Mucous membranes are moist.  Oropharynx non-erythematous. Neck: No stridor. Neck stiffness on exam.  Cardiovascular: Normal rate, regular rhythm. Good peripheral circulation. Systolic ejection murmur noted.  Respiratory: Normal respiratory effort.  No retractions. Lungs CTAB. Gastrointestinal: Soft and nontender. No distention.  Musculoskeletal: No lower extremity tenderness nor edema. No gross deformities of extremities. Neurologic:  Normal speech and language. No gross focal neurologic deficits are appreciated.  Skin:  Skin is warm and dry. Multiple pustular areas without frank abscess. Rash is blanching and non-patechial.    ____________________________________________   LABS (all labs ordered are listed, but only abnormal results are displayed)  Labs Reviewed  BASIC METABOLIC PANEL - Abnormal; Notable for the following components:      Result Value   Potassium 3.1 (*)    Glucose, Bld  153 (*)    Calcium 8.8 (*)    All other components within normal limits  HEPATIC FUNCTION PANEL - Abnormal; Notable for the following components:   AST 13 (*)    Indirect Bilirubin 0.2 (*)    All other components within normal limits  URINALYSIS, ROUTINE W REFLEX MICROSCOPIC - Abnormal; Notable for the following components:   Hgb urine dipstick MODERATE (*)    Protein, ur TRACE (*)    All other components within normal limits  CSF CELL COUNT WITH DIFFERENTIAL - Abnormal; Notable for the following components:   Appearance, CSF CLEAR (*)    RBC Count, CSF 65 (*)    All other components within normal limits  CSF CELL COUNT WITH DIFFERENTIAL - Abnormal; Notable for the following components:   Appearance, CSF CLEAR (*)    RBC Count, CSF 2 (*)    All other components within normal limits  GLUCOSE, CSF - Abnormal; Notable for the following components:   Glucose, CSF 95 (*)    All other components within normal limits  URINALYSIS, MICROSCOPIC (REFLEX) - Abnormal; Notable for the following components:   Bacteria, UA FEW (*)    All other components within normal limits  COMPREHENSIVE METABOLIC PANEL - Abnormal; Notable for the following components:   Glucose, Bld 159 (*)    Calcium 8.7 (*)    Albumin 3.1 (*)    All other components within normal limits  CBC WITH DIFFERENTIAL/PLATELET - Abnormal; Notable for the following components:   Abs Immature Granulocytes 0.10 (*)    All other components within normal limits  RAPID URINE DRUG SCREEN, HOSP PERFORMED - Abnormal; Notable for the following components:   Opiates POSITIVE (*)    Tetrahydrocannabinol POSITIVE (*)    All other components within normal limits  GLUCOSE, CAPILLARY - Abnormal; Notable for the following components:   Glucose-Capillary 181 (*)    All other components within normal limits  GLUCOSE, CAPILLARY - Abnormal; Notable for the following components:   Glucose-Capillary 231 (*)    All other components within normal  limits  GLUCOSE, CAPILLARY - Abnormal; Notable for the following components:   Glucose-Capillary 167 (*)    All other components within normal limits  CBG MONITORING, ED - Abnormal; Notable for the following components:   Glucose-Capillary 176 (*)    All other components within normal limits  CBG MONITORING, ED - Abnormal; Notable for the following components:   Glucose-Capillary 177 (*)    All other components within normal limits  URINE CULTURE  RESPIRATORY PANEL BY RT PCR (FLU A&B, COVID)  CULTURE, BLOOD (ROUTINE X 2)  CULTURE, BLOOD (ROUTINE X 2)  CSF CULTURE  GRAM STAIN  HSV CULTURE AND TYPING  CBC WITH DIFFERENTIAL/PLATELET  PROTIME-INR  LACTIC ACID, PLASMA  PROTEIN, CSF  HIV ANTIBODY (ROUTINE TESTING W REFLEX)  MAGNESIUM  TSH  PROCALCITONIN  HSV 1/2 PCR, CSF  HEMOGLOBIN A1C   ____________________________________________  RADIOLOGY  CT Head Wo Contrast  Result Date: 05/10/2020 CLINICAL DATA:  Mental status change fever  EXAM: CT HEAD WITHOUT CONTRAST TECHNIQUE: Contiguous axial images were obtained from the base of the skull through the vertex without intravenous contrast. COMPARISON:  None. FINDINGS: Brain: No evidence of acute infarction, hemorrhage, hydrocephalus, extra-axial collection or mass lesion/mass effect. Vascular: No hyperdense vessel or unexpected calcification. Skull: Normal. Negative for fracture or focal lesion. Sinuses/Orbits: No acute finding. Other: None. IMPRESSION: Negative non contrasted CT appearance of the brain. Electronically Signed   By: Donavan Foil M.D.   On: 05/10/2020 19:41    ____________________________________________   PROCEDURES  Procedure(s) performed:   .Lumbar Puncture  Date/Time: 05/10/2020 10:07 PM Performed by: Margette Fast, MD Authorized by: Margette Fast, MD   Consent:    Consent obtained:  Written   Consent given by:  Patient   Risks discussed:  Bleeding, headache, nerve damage, infection, repeat procedure  and pain Pre-procedure details:    Procedure purpose:  Diagnostic   Preparation: Patient was prepped and draped in usual sterile fashion   Anesthesia (see MAR for exact dosages):    Anesthesia method:  Local infiltration   Local anesthetic:  Lidocaine 1% w/o epi Procedure details:    Lumbar space:  L4-L5 interspace   Patient position:  Sitting   Needle gauge:  20   Needle length (in):  3.5   Number of attempts:  2 (unsuccessful) Post-procedure:    Puncture site:  Adhesive bandage applied   Patient tolerance of procedure:  Tolerated well, no immediate complications     ____________________________________________   INITIAL IMPRESSION / ASSESSMENT AND PLAN / ED COURSE  Pertinent labs & imaging results that were available during my care of the patient were reviewed by me and considered in my medical decision making (see chart for details).   Patient presents to the emergency department with continued headache, neck pain, diarrhea but is now developed fever.  She has pustular rash as well.  My suspicion overall for bacterial meningitis is very low given the length of symptoms but could not rule out viral type meningitis.  The pustular type rash seen on exam appears to be a source of fever.  She has mild tachycardia here along with a murmur.  Plan for IV antibiotics and admission.  Attempted LP but patient's body habitus and significant degenerative disc disease complicated a bedside attempt at LP.   LP unsuccessful. Patient with significant DDD and body habitus making procedure technically difficult at bedside. No immediate complications. Will start abx and consider LP under fluoroscopy in the AM.   Discussed patient's case with [facility] to request admission. Patient and family (if present) updated with plan. Care transferred to [facility] service.  I reviewed all nursing notes, vitals, pertinent old records, EKGs, labs, imaging (as  available).  ____________________________________________  FINAL CLINICAL IMPRESSION(S) / ED DIAGNOSES  Final diagnoses:  Fever in adult     MEDICATIONS GIVEN DURING THIS VISIT:  Medications  pantoprazole (PROTONIX) EC tablet 40 mg (40 mg Oral Given 05/12/20 0846)  albuterol (VENTOLIN HFA) 108 (90 Base) MCG/ACT inhaler 1-2 puff (has no administration in time range)  heparin injection 5,000 Units (5,000 Units Subcutaneous Given 05/12/20 0528)  acetaminophen (TYLENOL) tablet 650 mg (has no administration in time range)    Or  acetaminophen (TYLENOL) suppository 650 mg (has no administration in time range)  polyethylene glycol (MIRALAX / GLYCOLAX) packet 17 g (has no administration in time range)  ondansetron (ZOFRAN) tablet 4 mg (has no administration in time range)    Or  ondansetron (ZOFRAN) injection 4  mg (has no administration in time range)  nicotine (NICODERM CQ - dosed in mg/24 hours) patch 14 mg (14 mg Transdermal Patch Applied 05/12/20 1036)  nystatin (MYCOSTATIN) 100000 UNIT/ML suspension 500,000 Units (500,000 Units Oral Given 05/12/20 0846)  ibuprofen (ADVIL) tablet 800 mg (has no administration in time range)  sodium chloride 0.9 % bolus 500 mL (0 mLs Intravenous Stopped 05/10/20 2154)  acetaminophen (TYLENOL) tablet 1,000 mg (1,000 mg Oral Given 05/10/20 2025)  morphine 4 MG/ML injection 4 mg (4 mg Intravenous Given 05/10/20 2059)  ondansetron (ZOFRAN) injection 4 mg (4 mg Intravenous Given 05/10/20 2059)  cefTRIAXone (ROCEPHIN) 2 g in sodium chloride 0.9 % 100 mL IVPB (0 g Intravenous Stopped 05/10/20 2316)  dexamethasone (DECADRON) injection 10 mg (10 mg Intravenous Given 05/10/20 2202)  vancomycin (VANCOREADY) IVPB 2000 mg/400 mL (0 mg Intravenous Stopped 05/11/20 0043)  ketorolac (TORADOL) 30 MG/ML injection 30 mg (30 mg Intravenous Given 05/10/20 2353)  metoCLOPramide (REGLAN) injection 10 mg (10 mg Intravenous Given 05/10/20 2352)  diphenhydrAMINE (BENADRYL)  injection 25 mg (25 mg Intravenous Given 05/10/20 2352)  diazepam (VALIUM) injection 5 mg (5 mg Intravenous Given 05/11/20 0748)  lidocaine (PF) (XYLOCAINE) 1 % injection (3 mLs  Given 05/11/20 0940)  morphine 4 MG/ML injection 4 mg (4 mg Intravenous Given 05/11/20 1107)  diazepam (VALIUM) injection 5 mg (5 mg Intravenous Given 05/11/20 2315)  fentaNYL (SUBLIMAZE) injection 50 mcg (50 mcg Intravenous Given 05/11/20 1530)  ketorolac (TORADOL) 15 MG/ML injection 15 mg (15 mg Intravenous Given 05/11/20 2234)     NEW OUTPATIENT MEDICATIONS STARTED DURING THIS VISIT:  Discharge Medication List as of 05/12/2020  2:35 PM    START taking these medications   Details  bacitracin ointment Apply to affected area daily, Normal    nystatin (MYCOSTATIN) 100000 UNIT/ML suspension Take 5 mLs (500,000 Units total) by mouth 4 (four) times daily for 5 days., Starting Thu 05/12/2020, Until Tue 05/17/2020, Normal        Note:  This document was prepared using Dragon voice recognition software and may include unintentional dictation errors.  Nanda Quinton, MD, [facility] Emergency Medicine    Kamri Gotsch, [facility], MD 05/12/20 [phone]

## 2020-05-10 NOTE — H&P (Addendum)
TRH H&P    Patient Demographics:    [name], is a 44 y.o. female  MRN: [id number]  DOB - 07/01/1976  Admit Date - 05/10/2020  Referring MD/NP/PA: Long  Outpatient Primary MD for the patient is Patient, No Pcp Per  Patient coming from: Home  Chief complaint-headache   HPI:    [name]  is a 44 y.o. female, with history of IBS and morbid obesity presents with chief complaint of headache.  She reports that the pain feels like a vice grip around her head starting in the occipital region.  She reports neck stiffness and weakness.  She says her pain is worse if she turns her head left or right, and especially worse if she looks up.  Patient reports that she can tilt her head back in the shower to rinse her hair.  She reports that the pain in her neck feels like a pulled muscle feeling.  Even opening her mouth makes the pain worse.  This all started 4 weeks ago.  She reports this is her fifth visit to provider in those 4 weeks.  Patient reports [facility] and [facility] her with cat scratch fever.  She had had a cat bite 5 days prior to the onset of the headache.  She was placed on a Z-Pak for infected cat bite and cat scratch fever.  She reports that she had no relief.  Patient reports that she went to [place], and had x-rays was given a course of steroids.  She then came [facility] and saw Kem Parkinson gave her baclofen and diclofenac.  Patient reports no relief with that regimen,, but when speaking to the ED provider she reports that the patient had a lot of relief and fell asleep after the baclofen diclofenac.  Patient reports that she returned to [facility] today, who told her that she would need an LP and sent her to the ER.  Patient reports that she has felt feverish, but has not measured a fever at home because she has been taking acetaminophen and Advil around-the-clock.  Acetaminophen and  Advil do not do anything to help her pain.  Goody powder does give her some relief.  Patient reports photophobia and phonophobia associated.  She also reports emesis x3 on 1 day 4 days ago.  The emesis was yellow and nonbloody.  She reports diarrhea as well, but patient reports that she has IBS and she was has diarrhea or constipation.  Patient has had a second cat bite, that one was 3-1/2 weeks ago.  She reports that that one became infected as well.  But it looks like it is healing normally at this time cat who has been her twice is due for his rabies vaccine.  However the cat remains fine after these 3-1/2 weeks since last of bite.  Patient smokes a pack a day.  She uses marijuana twice a day every day.  Patient reports that she is using the same a trusted dealer for her marijuana.  She denies  any other illicit drugs.  Patient is not vaccinated for Covid because her girlfriend thinks that it will kill you.  Patient is full code    Review of systems:    In addition to the HPI above,  Review of Systems  Constitutional: Positive for fever and malaise/fatigue. Negative for chills and weight loss.  HENT: Positive for sinus pain. Negative for congestion and sore throat.   Eyes: Positive for photophobia. Negative for blurred vision and double vision.  Respiratory: Negative for cough, shortness of breath and wheezing.   Cardiovascular: Negative for chest pain, palpitations and leg swelling.  Gastrointestinal: Positive for constipation, diarrhea, nausea and vomiting. Negative for abdominal pain.  Genitourinary: Negative for dysuria, frequency and urgency.  Musculoskeletal: Positive for myalgias and neck pain. Negative for joint pain.  Skin: Positive for rash. Negative for itching.  Neurological: Positive for headaches. Negative for dizziness, tingling, tremors, sensory change, speech change, focal weakness and weakness.  Psychiatric/Behavioral: Positive for substance abuse. The patient has insomnia.      No significant Mental Stressors.  All other systems reviewed and are negative.    Past History of the following :    Past Medical History:  Diagnosis Date  . IBS (irritable bowel syndrome) 1994      Past Surgical History:  Procedure Laterality Date  . CESAREAN SECTION    . CHOLECYSTECTOMY        Social History:      Social History   Tobacco Use  . Smoking status: Current Every Day Smoker    Packs/day: 0.50    Types: Cigarettes  . Smokeless tobacco: Never Used  Substance Use Topics  . Alcohol use: No       Family History :     Family History  Problem Relation Age of Onset  . Diabetes Mother   . Crohn's disease Mother   . Diabetes Sister   . Hypertension Sister   . Diabetes Other       Home Medications:   Prior to Admission medications   Medication Sig Start Date End Date Taking? Authorizing Provider  acetaminophen (TYLENOL) 500 MG tablet Take 1,500 mg by mouth every 6 (six) hours as needed for mild pain.    Yes [provider]  baclofen (LIORESAL) 10 MG tablet Take 1 tablet (10 mg total) by mouth 3 (three) times daily. 05/02/20  Yes Triplett, Tammy, PA-C  diclofenac (VOLTAREN) 75 MG EC tablet Take 1 tablet (75 mg total) by mouth 2 (two) times daily. Take with food 05/02/20  Yes Triplett, Tammy, PA-C  dicyclomine (BENTYL) 10 MG capsule Take 1 capsule (10 mg total) by mouth daily as needed for spasms. 01/08/20  Yes Domenic Moras, PA-C  omeprazole (PRILOSEC) 20 MG capsule Take 20 mg by mouth daily.   Yes [provider]  albuterol (PROVENTIL HFA;VENTOLIN HFA) 108 (90 Base) MCG/ACT inhaler Inhale 1-2 puffs into the lungs every 6 (six) hours as needed for wheezing or shortness of breath.    [provider]     Allergies:     Allergies  Allergen Reactions  . Codeine Itching     Physical Exam:   Vitals  Blood pressure 127/64, pulse 100, temperature (!) 100.7 F (38.2 C), temperature source Oral, resp. rate (!) 21, height  5\' 4"  (1.626 m), weight 115.7 kg, SpO2 97 %.  1.  General: Resting comfortably in bed, texting on phone, conversing with girlfriend  2. Psychiatric: Cooperative with exam, very poor insight into healthcare  3. Neurologic:  Cranial nerves II through XII intact, finger-to-nose normal, sensation normal, moves all 4 extremities voluntarily, with equal strength  4. HEENMT:  Head is atraumatic, normocephalic, pupils are reactive, neck is supple, thyromegaly, trachea is midline, mucous membranes are moist  5. Respiratory : Lungs clear to auscultation bilaterally  6. Cardiovascular : Heart rate is normal, systolic murmur, regular rhythm, no rubs or gallops  7. Gastrointestinal:  Abdomen is obese, soft, nondistended Scarring from previous boils present  8. Skin:  Hidradenitis suppurativa on left thigh, boils on breast and buttock  9.Musculoskeletal:  No calf tenderness, no peripheral edema, no acute deformity    Data Review:    CBC Recent Labs  Lab 05/10/20 1909  WBC 8.3  HGB 14.1  HCT 42.3  PLT 365  MCV 91.2  MCH 30.4  MCHC 33.3  RDW 14.2  LYMPHSABS 2.3  MONOABS 0.9  EOSABS 0.2  BASOSABS 0.1   ------------------------------------------------------------------------------------------------------------------  Results for orders placed or performed during the hospital encounter of 05/10/20 (from the past 48 hour(s))  CBC with Differential     Status: None   Collection Time: 05/10/20  7:09 PM  Result Value Ref Range   WBC 8.3 4.0 - 10.5 K/uL   RBC 4.64 3.87 - 5.11 MIL/uL   Hemoglobin 14.1 12.0 - 15.0 g/dL   HCT 42.3 36 - 46 %   MCV 91.2 80.0 - 100.0 fL   MCH 30.4 26.0 - 34.0 pg   MCHC 33.3 30.0 - 36.0 g/dL   RDW 14.2 11.5 - 15.5 %   Platelets 365 150 - 400 K/uL   nRBC 0.0 0.0 - 0.2 %   Neutrophils Relative % 58 %   Neutro Abs 4.8 1.7 - 7.7 K/uL   Lymphocytes Relative 28 %   Lymphs Abs 2.3 0.7 - 4.0 K/uL   Monocytes Relative 10 %   Monocytes Absolute 0.9 0.1 -  1.0 K/uL   Eosinophils Relative 2 %   Eosinophils Absolute 0.2 0.0 - 0.5 K/uL   Basophils Relative 1 %   Basophils Absolute 0.1 0.0 - 0.1 K/uL   Immature Granulocytes 1 %   Abs Immature Granulocytes 0.05 0.00 - 0.07 K/uL    Comment: Performed at [facility], [address] [place], [place] [address]  Basic metabolic panel     Status: Abnormal   Collection Time: 05/10/20  7:09 PM  Result Value Ref Range   Sodium 137 135 - 145 mmol/L   Potassium 3.1 (L) 3.5 - 5.1 mmol/L   Chloride 102 98 - 111 mmol/L   CO2 25 22 - 32 mmol/L   Glucose, Bld 153 (H) 70 - 99 mg/dL    Comment: Glucose reference range applies only to samples taken after fasting for at least 8 hours.   BUN 12 6 - 20 mg/dL   Creatinine, Ser 0.83 0.44 - 1.00 mg/dL   Calcium 8.8 (L) 8.9 - 10.3 mg/dL   GFR, Estimated >60 >60 mL/min   Anion gap 10 5 - 15    Comment: Performed at [facility], [address], [place], [place] [address]  Urinalysis, Routine w reflex microscopic Urine, Clean Catch     Status: Abnormal   Collection Time: 05/10/20  7:13 PM  Result Value Ref Range   Color, Urine YELLOW YELLOW   APPearance CLEAR CLEAR   Specific Gravity, Urine 1.025 1.005 - 1.030   pH 6.0 5.0 - 8.0   Glucose, UA NEGATIVE NEGATIVE mg/dL   Hgb urine dipstick MODERATE (A) NEGATIVE  Bilirubin Urine NEGATIVE NEGATIVE   Ketones, ur NEGATIVE NEGATIVE mg/dL   Protein, ur TRACE (A) NEGATIVE mg/dL   Nitrite NEGATIVE NEGATIVE   Leukocytes,Ua NEGATIVE NEGATIVE    Comment: Performed at [facility], [address] [place], [place] [address]  Urinalysis, Microscopic (reflex)     Status: Abnormal   Collection Time: 05/10/20  7:13 PM  Result Value Ref Range   RBC / HPF 6-10 0 - 5 RBC/hpf   WBC, UA 6-10 0 - 5 WBC/hpf   Bacteria, UA FEW (A) NONE SEEN   Squamous Epithelial / LPF 11-20 0 - 5   Hyaline Casts, UA CA OXALATE CRYSTALS    Urine-Other HYALINE CASTS     Comment: Performed at [facility], [address], [place], [place] [address]   Respiratory Panel by RT PCR (Flu A&B, Covid) - Nasopharyngeal Swab     Status: None   Collection Time: 05/10/20  7:14 PM   Specimen: Nasopharyngeal Swab  Result Value Ref Range   SARS Coronavirus 2 by RT PCR NEGATIVE NEGATIVE    Comment: (NOTE) SARS-CoV-2 target nucleic acids are NOT DETECTED.  The SARS-CoV-2 RNA is generally detectable in upper respiratoy specimens during the acute phase of infection. The lowest concentration of SARS-CoV-2 viral copies this assay can detect is 131 copies/mL. A negative result does not preclude SARS-Cov-2 infection and should not be used as the sole basis for treatment or other patient management decisions. A negative result may occur with  improper specimen collection/handling, submission of specimen other than nasopharyngeal swab, presence of viral mutation(s) within the areas targeted by this assay, and inadequate number of viral copies (<131 copies/mL). A negative result must be combined with clinical observations, patient history, and epidemiological information. The expected result is Negative.  Fact Sheet for Patients:  PinkCheek.be  Fact Sheet for Healthcare Providers:  GravelBags.it  This test is no t yet approved or cleared by the [place] FDA and  has been authorized for detection and/or diagnosis of SARS-CoV-2 by FDA under an Emergency Use Authorization (EUA). This EUA will remain  in effect (meaning this test can be used) for the duration of the COVID-19 declaration under Section 564(b)(1) of the Act, 21 U.S.C. section 360bbb-3(b)(1), unless the authorization is terminated or revoked sooner.     Influenza A by PCR NEGATIVE NEGATIVE   Influenza B by PCR NEGATIVE NEGATIVE    Comment: (NOTE) The Xpert Xpress SARS-CoV-2/FLU/RSV assay is intended as an aid in  the diagnosis of influenza from Nasopharyngeal swab specimens and  should not be used as a sole basis for  treatment. Nasal washings and  aspirates are unacceptable for Xpert Xpress SARS-CoV-2/FLU/RSV  testing.  Fact Sheet for Patients: PinkCheek.be  Fact Sheet for Healthcare Providers: GravelBags.it  This test is not yet approved or cleared by the [place] FDA and  has been authorized for detection and/or diagnosis of SARS-CoV-2 by  FDA under an Emergency Use Authorization (EUA). This EUA will remain  in effect (meaning this test can be used) for the duration of the  Covid-19 declaration under Section 564(b)(1) of the Act, 21  U.S.C. section 360bbb-3(b)(1), unless the authorization is  terminated or revoked. Performed at [facility], [address], [place], [place] [address]   Hepatic function panel     Status: Abnormal   Collection Time: 05/10/20  7:18 PM  Result Value Ref Range   Total Protein 7.4 6.5 - 8.1 g/dL   Albumin 3.6 3.5 - 5.0 g/dL  AST 13 (L) 15 - 41 U/L   ALT 24 0 - 44 U/L   Alkaline Phosphatase 99 38 - 126 U/L   Total Bilirubin 0.3 0.3 - 1.2 mg/dL   Bilirubin, Direct 0.1 0.0 - 0.2 mg/dL   Indirect Bilirubin 0.2 (L) 0.3 - 0.9 mg/dL    Comment: Performed at [facility], [address], [place], [place] [address]  Protime-INR     Status: None   Collection Time: 05/10/20  7:18 PM  Result Value Ref Range   Prothrombin Time 12.3 11.4 - 15.2 seconds   INR 1.0 0.8 - 1.2    Comment: (NOTE) INR goal varies based on device and disease states. Performed at [facility], [address], [place], [place] [address]   Lactic acid, plasma     Status: None   Collection Time: 05/10/20  7:18 PM  Result Value Ref Range   Lactic Acid, Venous 1.8 0.5 - 1.9 mmol/L    Comment: Performed at [facility], [address], [place], [place] [address]  Culture, blood (routine x 2)     Status: None (Preliminary result)   Collection Time: 05/10/20  7:43 PM   Specimen: Right Antecubital; Blood  Result Value Ref Range   Specimen  Description RIGHT ANTECUBITAL    Special Requests      BOTTLES DRAWN AEROBIC AND ANAEROBIC Blood Culture adequate volume Performed at [facility], [address], [place], [place] [address]    Culture PENDING    Report Status PENDING   Culture, blood (routine x 2)     Status: None (Preliminary result)   Collection Time: 05/10/20  7:43 PM   Specimen: BLOOD RIGHT HAND  Result Value Ref Range   Specimen Description BLOOD RIGHT HAND    Special Requests      BOTTLES DRAWN AEROBIC AND ANAEROBIC Blood Culture adequate volume Performed at [facility], [address] [place], [place] [address]    Culture PENDING    Report Status PENDING     Chemistries  Recent Labs  Lab 05/10/20 1909 05/10/20 1918  NA 137  --   K 3.1*  --   CL 102  --   CO2 25  --   GLUCOSE 153*  --   BUN 12  --   CREATININE 0.83  --   CALCIUM 8.8*  --   AST  --  13*  ALT  --  24  ALKPHOS  --  99  BILITOT  --  0.3   ------------------------------------------------------------------------------------------------------------------  ------------------------------------------------------------------------------------------------------------------ GFR: Estimated Creatinine Clearance: 106.9 mL/min (by C-G formula based on SCr of 0.83 mg/dL). Liver Function Tests: Recent Labs  Lab 05/10/20 1918  AST 13*  ALT 24  ALKPHOS 99  BILITOT 0.3  PROT 7.4  ALBUMIN 3.6   No results for input(s): LIPASE, AMYLASE in the last 168 hours. No results for input(s): AMMONIA in the last 168 hours. Coagulation Profile: Recent Labs  Lab 05/10/20 1918  INR 1.0   Cardiac Enzymes: No results for input(s): CKTOTAL, CKMB, CKMBINDEX, TROPONINI in the last 168 hours. BNP (last 3 results) No results for input(s): PROBNP in the last 8760 hours. HbA1C: No results for input(s): HGBA1C in the last 72 hours. CBG: No results for input(s): GLUCAP in the last 168 hours. Lipid Profile: No results for input(s): CHOL, HDL, LDLCALC,  TRIG, CHOLHDL, LDLDIRECT in the last 72 hours. Thyroid Function Tests: No results for input(s): TSH, T4TOTAL, FREET4, T3FREE, THYROIDAB in the last 72 hours. Anemia Panel: No results  for input(s): VITAMINB12, FOLATE, FERRITIN, TIBC, IRON, RETICCTPCT in the last 72 hours.  --------------------------------------------------------------------------------------------------------------- Urine analysis:    Component Value Date/Time   COLORURINE YELLOW 05/10/2020 1913   APPEARANCEUR CLEAR 05/10/2020 1913   LABSPEC 1.025 05/10/2020 1913   PHURINE 6.0 05/10/2020 1913   GLUCOSEU NEGATIVE 05/10/2020 1913   HGBUR MODERATE (A) 05/10/2020 1913   BILIRUBINUR NEGATIVE 05/10/2020 1913   KETONESUR NEGATIVE 05/10/2020 1913   PROTEINUR TRACE (A) 05/10/2020 1913   UROBILINOGEN 0.2 05/18/2008 1439   NITRITE NEGATIVE 05/10/2020 [place] 05/10/2020 1913      Imaging Results:    CT Head Wo Contrast  Result Date: 05/10/2020 CLINICAL DATA:  Mental status change fever EXAM: CT HEAD WITHOUT CONTRAST TECHNIQUE: Contiguous axial images were obtained from the base of the skull through the vertex without intravenous contrast. COMPARISON:  None. FINDINGS: Brain: No evidence of acute infarction, hemorrhage, hydrocephalus, extra-axial collection or mass lesion/mass effect. Vascular: No hyperdense vessel or unexpected calcification. Skull: Normal. Negative for fracture or focal lesion. Sinuses/Orbits: No acute finding. Other: None. IMPRESSION: Negative non contrasted CT appearance of the brain. Electronically Signed   By: Donavan Foil M.D.   On: 05/10/2020 19:41       Assessment & Plan:    Active Problems:   Fever in adult   1. Fever in adult 1. T 100.7 2. No leukocytosis 3. Previously treated outpatient with Z pack 4. Pustular rash is likely the cause 5. With headache, stiff neck, and neck pain - LP was attempted to r/o meningitis  6. Fluoro LP ordered for AM 7. On Rocephin and  Vanc 8. Blood cultures pending 2. Neck and head pain 1. Headache cocktail 2. Valium ordered PRN x 1 for muscle spasm 3. Cat bite 1. Patient reports repeated cat bites in a cat who is not up to date on rabies vaccine.  2. Vaccine last given >2 years ago 3. Cat remains fine 3.5 weeks after most recent bite 4. Treated with Z pack outpatient 5. Now on Rocephin/Vanc combo 6. Wounds healing well 4. Tobacco dependence 1. Nicotine patch 5. Thrush 1. Nystatin swish and swollow 6. Pustular rash 1. Rash on thigh is more likely SH 2. Boils on breast and buttock seem to be part of a chronic process  7. Suppurativa hidradenitis 1. Continue steroid 8. Thyromegaly 1. Check TSH    DVT Prophylaxis-  Heparin - SCDs   AM Labs Ordered, also please review Full Orders  Family Communication: Admission, patients condition and plan of care including tests being ordered have been discussed with the patient and girlfriend who indicate understanding and agree with the plan and Code Status.  Code Status:  Full  Admission status: Observation Time spent in minutes : Winifred

## 2020-05-11 ENCOUNTER — Observation Stay (HOSPITAL_COMMUNITY): Payer: Medicaid - Out of State

## 2020-05-11 DIAGNOSIS — F17210 Nicotine dependence, cigarettes, uncomplicated: Secondary | ICD-10-CM | POA: Diagnosis present

## 2020-05-11 DIAGNOSIS — S41151D Open bite of right upper arm, subsequent encounter: Secondary | ICD-10-CM | POA: Diagnosis not present

## 2020-05-11 DIAGNOSIS — R509 Fever, unspecified: Secondary | ICD-10-CM | POA: Diagnosis present

## 2020-05-11 DIAGNOSIS — B379 Candidiasis, unspecified: Secondary | ICD-10-CM | POA: Diagnosis present

## 2020-05-11 DIAGNOSIS — Z833 Family history of diabetes mellitus: Secondary | ICD-10-CM | POA: Diagnosis not present

## 2020-05-11 DIAGNOSIS — R739 Hyperglycemia, unspecified: Secondary | ICD-10-CM | POA: Diagnosis present

## 2020-05-11 DIAGNOSIS — Z20822 Contact with and (suspected) exposure to covid-19: Secondary | ICD-10-CM | POA: Diagnosis present

## 2020-05-11 DIAGNOSIS — M7918 Myalgia, other site: Secondary | ICD-10-CM | POA: Diagnosis present

## 2020-05-11 DIAGNOSIS — Z8249 Family history of ischemic heart disease and other diseases of the circulatory system: Secondary | ICD-10-CM | POA: Diagnosis not present

## 2020-05-11 DIAGNOSIS — W5501XD Bitten by cat, subsequent encounter: Secondary | ICD-10-CM | POA: Diagnosis not present

## 2020-05-11 DIAGNOSIS — L732 Hidradenitis suppurativa: Secondary | ICD-10-CM | POA: Diagnosis present

## 2020-05-11 DIAGNOSIS — G039 Meningitis, unspecified: Secondary | ICD-10-CM

## 2020-05-11 DIAGNOSIS — Z6841 Body Mass Index (BMI) 40.0 and over, adult: Secondary | ICD-10-CM | POA: Diagnosis not present

## 2020-05-11 DIAGNOSIS — K589 Irritable bowel syndrome without diarrhea: Secondary | ICD-10-CM | POA: Diagnosis present

## 2020-05-11 DIAGNOSIS — B370 Candidal stomatitis: Secondary | ICD-10-CM

## 2020-05-11 DIAGNOSIS — R519 Headache, unspecified: Secondary | ICD-10-CM | POA: Diagnosis present

## 2020-05-11 DIAGNOSIS — M542 Cervicalgia: Secondary | ICD-10-CM | POA: Diagnosis present

## 2020-05-11 DIAGNOSIS — L0292 Furuncle, unspecified: Secondary | ICD-10-CM | POA: Diagnosis present

## 2020-05-11 LAB — CBC WITH DIFFERENTIAL/PLATELET
Abs Immature Granulocytes: 0.1 10*3/uL — ABNORMAL HIGH (ref 0.00–0.07)
Basophils Absolute: 0.1 10*3/uL (ref 0.0–0.1)
Basophils Relative: 1 %
Eosinophils Absolute: 0 10*3/uL (ref 0.0–0.5)
Eosinophils Relative: 0 %
HCT: 40.9 % (ref 36.0–46.0)
Hemoglobin: 13.4 g/dL (ref 12.0–15.0)
Immature Granulocytes: 1 %
Lymphocytes Relative: 20 %
Lymphs Abs: 1.6 10*3/uL (ref 0.7–4.0)
MCH: 30.6 pg (ref 26.0–34.0)
MCHC: 32.8 g/dL (ref 30.0–36.0)
MCV: 93.4 fL (ref 80.0–100.0)
Monocytes Absolute: 0.2 10*3/uL (ref 0.1–1.0)
Monocytes Relative: 3 %
Neutro Abs: 5.9 10*3/uL (ref 1.7–7.7)
Neutrophils Relative %: 75 %
Platelets: 339 10*3/uL (ref 150–400)
RBC: 4.38 MIL/uL (ref 3.87–5.11)
RDW: 14.2 % (ref 11.5–15.5)
WBC: 8 10*3/uL (ref 4.0–10.5)
nRBC: 0 % (ref 0.0–0.2)

## 2020-05-11 LAB — COMPREHENSIVE METABOLIC PANEL
ALT: 25 U/L (ref 0–44)
AST: 19 U/L (ref 15–41)
Albumin: 3.1 g/dL — ABNORMAL LOW (ref 3.5–5.0)
Alkaline Phosphatase: 98 U/L (ref 38–126)
Anion gap: 9 (ref 5–15)
BUN: 11 mg/dL (ref 6–20)
CO2: 23 mmol/L (ref 22–32)
Calcium: 8.7 mg/dL — ABNORMAL LOW (ref 8.9–10.3)
Chloride: 105 mmol/L (ref 98–111)
Creatinine, Ser: 0.79 mg/dL (ref 0.44–1.00)
GFR, Estimated: >60 mL/min (ref >60)
Glucose, Bld: 159 mg/dL — ABNORMAL HIGH (ref 70–99)
Potassium: 3.9 mmol/L (ref 3.5–5.1)
Sodium: 137 mmol/L (ref 135–145)
Total Bilirubin: 0.4 mg/dL (ref 0.3–1.2)
Total Protein: 6.8 g/dL (ref 6.5–8.1)

## 2020-05-11 LAB — CSF CELL COUNT WITH DIFFERENTIAL
RBC Count, CSF: 65 /mm3 — ABNORMAL HIGH
RBC Count, CSF: 2 /mm3 — ABNORMAL HIGH
Tube #: 1
Tube #: 4
WBC, CSF: 2 /mm3 (ref 0–5)
WBC, CSF: 1 /mm3 (ref 0–5)

## 2020-05-11 LAB — GRAM STAIN: Gram Stain: NONE SEEN

## 2020-05-11 LAB — CBG MONITORING, ED
Glucose-Capillary: 176 mg/dL — ABNORMAL HIGH (ref 70–99)
Glucose-Capillary: 177 mg/dL — ABNORMAL HIGH (ref 70–99)

## 2020-05-11 LAB — RAPID URINE DRUG SCREEN, HOSP PERFORMED
Amphetamines: NOT DETECTED
Barbiturates: NOT DETECTED
Benzodiazepines: NOT DETECTED
Cocaine: NOT DETECTED
Opiates: POSITIVE — AB
Tetrahydrocannabinol: POSITIVE — AB

## 2020-05-11 LAB — MAGNESIUM: Magnesium: 2.1 mg/dL (ref 1.7–2.4)

## 2020-05-11 LAB — PROTEIN, CSF: Total  Protein, CSF: 42 mg/dL (ref 15–45)

## 2020-05-11 LAB — TSH: TSH: 4.082 u[IU]/mL (ref 0.350–4.500)

## 2020-05-11 LAB — HIV ANTIBODY (ROUTINE TESTING W REFLEX): HIV Screen 4th Generation wRfx: NONREACTIVE

## 2020-05-11 LAB — GLUCOSE, CSF: Glucose, CSF: 95 mg/dL — ABNORMAL HIGH (ref 40–70)

## 2020-05-11 LAB — GLUCOSE, CAPILLARY: Glucose-Capillary: 181 mg/dL — ABNORMAL HIGH (ref 70–99)

## 2020-05-11 MED ORDER — ONDANSETRON HCL 4 MG PO TABS: 4.0000 mg | ORAL_TABLET | Freq: Four times a day (QID) | ORAL | Status: DC | PRN

## 2020-05-11 MED ORDER — POVIDONE-IODINE 10 % EX SOLN
CUTANEOUS | Status: AC
  Filled 2020-05-11: qty 15

## 2020-05-11 MED ORDER — DEXTROSE 5 % IV SOLN
10.0000 mg/kg | Freq: Three times a day (TID) | INTRAVENOUS | Status: DC
  Administered 2020-05-11 – 2020-05-12 (×2): 790 mg via INTRAVENOUS
  Filled 2020-05-11 (×6): qty 15.8

## 2020-05-11 MED ORDER — NYSTATIN 100000 UNIT/ML MT SUSP
5.0000 mL | Freq: Four times a day (QID) | OROMUCOSAL | Status: DC
  Administered 2020-05-11 – 2020-05-12 (×5): 500000 [IU] via ORAL
  Filled 2020-05-11 (×5): qty 5

## 2020-05-11 MED ORDER — LIDOCAINE HCL (PF) 1 % IJ SOLN
INTRAMUSCULAR | Status: AC
  Administered 2020-05-11: 3 mL
  Filled 2020-05-11: qty 5

## 2020-05-11 MED ORDER — KETOROLAC TROMETHAMINE 15 MG/ML IJ SOLN
15.0000 mg | Freq: Once | INTRAMUSCULAR | Status: AC
  Administered 2020-05-11: 15 mg via INTRAVENOUS
  Filled 2020-05-11: qty 1

## 2020-05-11 MED ORDER — DIAZEPAM 5 MG/ML IJ SOLN
5.0000 mg | Freq: Two times a day (BID) | INTRAMUSCULAR | Status: AC | PRN
  Administered 2020-05-11: 5 mg via INTRAVENOUS
  Filled 2020-05-11: qty 2

## 2020-05-11 MED ORDER — POLYETHYLENE GLYCOL 3350 17 G PO PACK: 17.0000 g | PACK | Freq: Every day | ORAL | Status: DC | PRN

## 2020-05-11 MED ORDER — FENTANYL CITRATE (PF) 100 MCG/2ML IJ SOLN
50.0000 ug | Freq: Once | INTRAMUSCULAR | Status: AC
  Administered 2020-05-11: 50 ug via INTRAVENOUS
  Filled 2020-05-11: qty 2

## 2020-05-11 MED ORDER — ACYCLOVIR SODIUM 50 MG/ML IV SOLN
INTRAVENOUS | Status: AC
  Filled 2020-05-11: qty 10

## 2020-05-11 MED ORDER — ALBUTEROL SULFATE HFA 108 (90 BASE) MCG/ACT IN AERS: 1.0000 | INHALATION_SPRAY | Freq: Four times a day (QID) | RESPIRATORY_TRACT | Status: DC | PRN

## 2020-05-11 MED ORDER — MORPHINE SULFATE (PF) 4 MG/ML IV SOLN
4.0000 mg | Freq: Once | INTRAVENOUS | Status: AC
  Administered 2020-05-11: 4 mg via INTRAVENOUS
  Filled 2020-05-11: qty 1

## 2020-05-11 MED ORDER — DEXTROSE 5 % IV SOLN: 1000.0000 mg | Freq: Three times a day (TID) | INTRAVENOUS | Status: DC

## 2020-05-11 MED ORDER — ONDANSETRON HCL 4 MG/2ML IJ SOLN: 4.0000 mg | Freq: Four times a day (QID) | INTRAMUSCULAR | Status: DC | PRN

## 2020-05-11 MED ORDER — NICOTINE 14 MG/24HR TD PT24
14.0000 mg | MEDICATED_PATCH | Freq: Every day | TRANSDERMAL | Status: DC
  Administered 2020-05-11 – 2020-05-12 (×2): 14 mg via TRANSDERMAL
  Filled 2020-05-11 (×2): qty 1

## 2020-05-11 MED ORDER — ACETAMINOPHEN 325 MG PO TABS: 650.0000 mg | ORAL_TABLET | Freq: Four times a day (QID) | ORAL | Status: DC | PRN

## 2020-05-11 MED ORDER — DIAZEPAM 5 MG/ML IJ SOLN
5.0000 mg | Freq: Once | INTRAMUSCULAR | Status: AC | PRN
  Administered 2020-05-11: 5 mg via INTRAVENOUS
  Filled 2020-05-11: qty 2

## 2020-05-11 MED ORDER — DEXAMETHASONE SODIUM PHOSPHATE 10 MG/ML IJ SOLN
10.0000 mg | Freq: Four times a day (QID) | INTRAMUSCULAR | Status: DC
  Administered 2020-05-11 – 2020-05-12 (×3): 10 mg via INTRAVENOUS
  Filled 2020-05-11 (×3): qty 1

## 2020-05-11 MED ORDER — ACETAMINOPHEN 650 MG RE SUPP: 650.0000 mg | Freq: Four times a day (QID) | RECTAL | Status: DC | PRN

## 2020-05-11 MED ORDER — PANTOPRAZOLE SODIUM 40 MG PO TBEC
40.0000 mg | DELAYED_RELEASE_TABLET | Freq: Every day | ORAL | Status: DC
  Administered 2020-05-11 – 2020-05-12 (×2): 40 mg via ORAL
  Filled 2020-05-11 (×2): qty 1

## 2020-05-11 MED ORDER — HEPARIN SODIUM (PORCINE) 5000 UNIT/ML IJ SOLN
5000.0000 [IU] | Freq: Three times a day (TID) | INTRAMUSCULAR | Status: DC
  Administered 2020-05-11 – 2020-05-12 (×3): 5000 [IU] via SUBCUTANEOUS
  Filled 2020-05-11 (×4): qty 1

## 2020-05-11 NOTE — Procedures (Signed)
Preprocedure Dx: Headache Postprocedure Dx: Headache Procedure:  Fluoroscopically guided lumbar puncture Radiologist:  Thornton Papas Anesthesia:  8 ml of 1% lidocaine Specimen:  9 ml CSF, clear  EBL:   < 1 ml Opening pressure: 19 cm H2O (measured prone) Complications: None

## 2020-05-11 NOTE — Progress Notes (Signed)
PROGRESS NOTE    [name]  GBT:[id number] DOB: February 01, 1976 DOA: 05/10/2020 PCP: Patient, No Pcp Per   Chief Complaint  Patient presents with  . Neck Pain    Brief Narrative:  As per H&P written by Dr. Clearence Ped on 05/10/20 [name]  is a 44 y.o. female, with history of IBS and morbid obesity presents with chief complaint of headache.  She reports that the pain feels like a vice grip around her head starting in the occipital region.  She reports neck stiffness and weakness.  She says her pain is worse if she turns her head left or right, and especially worse if she looks up.  Patient reports that she can tilt her head back in the shower to rinse her hair.  She reports that the pain in her neck feels like a pulled muscle feeling.  Even opening her mouth makes the pain worse.  This all started 4 weeks ago.  She reports this is her fifth visit to provider in those 4 weeks.  [facility] and [facility] her with cat scratch fever.  She had had a cat bite 5 days prior to the onset of the headache.  She was placed on a Z-Pak for infected cat bite and cat scratch fever.  She reports that she had no relief.  Patient reports that she went to [facility], and had x-rays was given a course of steroids.  She then came [facility] and saw Kem Parkinson gave her baclofen and diclofenac.  Patient reports no relief with that regimen,, but when speaking to the ED provider she reports that the patient had a lot of relief and fell asleep after the baclofen diclofenac.  Patient reports that she returned to med[facility] today, who told her that she would need an LP and sent her to the ER.  Patient reports that she has felt feverish, but has not measured a fever at home because she has been taking acetaminophen and Advil around-the-clock.  Acetaminophen and Advil do not do anything to help her pain.  Goody powder does give her some relief.  Patient reports photophobia and phonophobia  associated.  She also reports emesis x3 on 1 day 4 days ago.  The emesis was yellow and nonbloody.  She reports diarrhea as well, but patient reports that she has IBS and she was has diarrhea or constipation.  Patient has had a second cat bite, that one was 3-1/2 weeks ago.  She reports that that one became infected as well.  But it looks like it is healing normally at this time cat who has been her twice is due for his rabies vaccine.  However the cat remains fine after these 3-1/2 weeks since last of bite.  Patient smokes a pack a day.  She uses marijuana twice a day every day.  Patient reports that she is using the same a trusted dealer for her marijuana.  She denies any other illicit drugs.  Patient is not vaccinated for Covid because her girlfriend thinks that it will kill you.  Patient is full code  Assessment & Plan: 1-fever/headache: With concern for meningitis -Lumbar puncture done and CSF analysis/culture pending at discharge -Empirically started on vancomycin, Rocephin and acyclovir) to cover for bacterial and aseptic meningitis. -patient also started on decadro X 4 days -Continue as needed analgesics and antipyretics.  2-history of cat bite -Overall bite wound healing appropriately -Current antibiotic empirically started for: #1 we will  provide skin coverage -No signs of superimposed infection.  3-suppurativa hidradenitis -Continue current management -No signs of IND needs at this time -Warm compresses advised.  4-morbid obesity -Body mass index is 43.77 kg/m. -Low calorie diet, portion control and increase physical activity discussed.  5-tobacco abuse/marijuana use -Cessation counseling has been provided-nicotine patch has been ordered.  6-thrush -Will continue nystatin  7-hyperglycemia -no prior hx of DM -will check A1C and follow CBG's, if > 200 will start coverage  DVT prophylaxis: Heparin Code Status: Full code Family Communication: No family at  bedside Disposition:   Status is: Inpatient  Dispo: The patient is from: Home              Anticipated d/c is to: Home              Anticipated d/c date is: To be determined              Patient currently no medically stable for discharge; continue complaining of headache, photophobia and now some back pain.  Will continue providing empirical antibiotic therapy follow temperature curve and clinical response.    Consultants:   None   Procedures:  Lumbar puncture 05/11/2020: CSF analysis/results pending   Antimicrobials:  Empirically started on vancomycin, Rocephin and acyclovir.   Subjective: Still complaining of headaches and photophobia; patient also expressed some neck rigidity and pain.  Afebrile at this time.  No nausea, no vomiting.  Complaining of pain in her back after lumbar puncture.  Objective: Vitals:   05/11/20 0630 05/11/20 1046 05/11/20 1147 05/11/20 1514  BP: 119/76 116/72 120/78 118/62  Pulse: 99 86 82 80  Resp: 17 18 20 20   Temp:  98.6 F (37 C) 98.6 F (37 C) 97.9 F (36.6 C)  TempSrc:  Oral Oral Oral  SpO2: 95% 96% 96% 98%  Weight:      Height:        Intake/Output Summary (Last 24 hours) at 05/11/2020 1801 Last data filed at 05/11/2020 1737 Gross per 24 hour  Intake 900 ml  Output --  Net 900 ml   Filed Weights   05/10/20 1849  Weight: 115.7 kg    Examination:  General exam: Currently afebrile, denies chest pain, no nausea, no vomiting.  Patient continues present intermittent headaches and now complaining also of back pain after lumbar pucnture.  Respiratory system: Clear to auscultation. Respiratory effort normal. Cardiovascular system: S1 & S2 heard, RRR. No JVD, murmurs, rubs, gallops or clicks. No pedal edema. Gastrointestinal system: Abdomen is obese, nondistended, soft and nontender. No organomegaly or masses felt. Normal bowel sounds heard. Central nervous system: Alert and oriented.  Moving 4 limbs spontaneously; no focal  deficits. Extremities: No cyanosis or clubbing. Skin: Patient with pustular rash suggestive of hidradenitis suppurativa on her left thigh, also with some boils and changes in her skin (breast and buttocks; this 1 has not been assessed today due to the lack of chaperone). Psychiatry: Judgement and insight appear normal. Mood & affect appropriate.     Data Reviewed: I have personally reviewed following labs and imaging studies  CBC: Recent Labs  Lab 05/10/20 1909 05/11/20 0235  WBC 8.3 8.0  NEUTROABS 4.8 5.9  HGB 14.1 13.4  HCT 42.3 40.9  MCV 91.2 93.4  PLT 365 381    Basic Metabolic Panel: Recent Labs  Lab 05/10/20 1909 05/11/20 0235  NA 137 137  K 3.1* 3.9  CL 102 105  CO2 25 23  GLUCOSE 153* 159*  BUN 12 11  CREATININE 0.83 0.79  CALCIUM 8.8* 8.7*  MG  --  2.1    GFR: Estimated Creatinine Clearance: 110.9 mL/min (by C-G formula based on SCr of 0.79 mg/dL).  Liver Function Tests: Recent Labs  Lab 05/10/20 1918 05/11/20 0235  AST 13* 19  ALT 24 25  ALKPHOS 99 98  BILITOT 0.3 0.4  PROT 7.4 6.8  ALBUMIN 3.6 3.1*    CBG: Recent Labs  Lab 05/11/20 0750 05/11/20 1138  GLUCAP 176* 177*     Recent Results (from the past 240 hour(s))  Respiratory Panel by RT PCR (Flu A&B, Covid) - Nasopharyngeal Swab     Status: None   Collection Time: 05/10/20  7:14 PM   Specimen: Nasopharyngeal Swab  Result Value Ref Range Status   SARS Coronavirus 2 by RT PCR NEGATIVE NEGATIVE Final    Comment: (NOTE) SARS-CoV-2 target nucleic acids are NOT DETECTED.  The SARS-CoV-2 RNA is generally detectable in upper respiratoy specimens during the acute phase of infection. The lowest concentration of SARS-CoV-2 viral copies this assay can detect is 131 copies/mL. A negative result does not preclude SARS-Cov-2 infection and should not be used as the sole basis for treatment or other patient management decisions. A negative result may occur with  improper specimen  collection/handling, submission of specimen other than nasopharyngeal swab, presence of viral mutation(s) within the areas targeted by this assay, and inadequate number of viral copies (<131 copies/mL). A negative result must be combined with clinical observations, patient history, and epidemiological information. The expected result is Negative.  Fact Sheet for Patients:  PinkCheek.be  Fact Sheet for Healthcare Providers:  GravelBags.it  This test is no t yet approved or cleared by the [place] FDA and  has been authorized for detection and/or diagnosis of SARS-CoV-2 by FDA under an Emergency Use Authorization (EUA). This EUA will remain  in effect (meaning this test can be used) for the duration of the COVID-19 declaration under Section 564(b)(1) of the Act, 21 U.S.C. section 360bbb-3(b)(1), unless the authorization is terminated or revoked sooner.     Influenza A by PCR NEGATIVE NEGATIVE Final   Influenza B by PCR NEGATIVE NEGATIVE Final    Comment: (NOTE) The Xpert Xpress SARS-CoV-2/FLU/RSV assay is intended as an aid in  the diagnosis of influenza from Nasopharyngeal swab specimens and  should not be used as a sole basis for treatment. Nasal washings and  aspirates are unacceptable for Xpert Xpress SARS-CoV-2/FLU/RSV  testing.  Fact Sheet for Patients: PinkCheek.be  Fact Sheet for Healthcare Providers: GravelBags.it  This test is not yet approved or cleared by the [place] FDA and  has been authorized for detection and/or diagnosis of SARS-CoV-2 by  FDA under an Emergency Use Authorization (EUA). This EUA will remain  in effect (meaning this test can be used) for the duration of the  Covid-19 declaration under Section 564(b)(1) of the Act, 21  U.S.C. section 360bbb-3(b)(1), unless the authorization is  terminated or revoked. Performed at [facility], [address] [place], [place] [address]   Culture, blood (routine x 2)     Status: None (Preliminary result)   Collection Time: 05/10/20  7:43 PM   Specimen: Right Antecubital; Blood  Result Value Ref Range Status   Specimen Description RIGHT ANTECUBITAL  Final   Special Requests   Final    BOTTLES DRAWN AEROBIC AND ANAEROBIC Blood Culture adequate volume   Culture   Final    NO GROWTH < 12 HOURS Performed at [facility], [address] [place], [place] [address]    Report Status PENDING  Incomplete  Culture, blood (routine x 2)     Status: None (Preliminary result)   Collection Time: 05/10/20  7:43 PM   Specimen: BLOOD RIGHT HAND  Result Value Ref Range Status   Specimen Description BLOOD RIGHT HAND  Final   Special Requests   Final    BOTTLES DRAWN AEROBIC AND ANAEROBIC Blood Culture adequate volume   Culture   Final    NO GROWTH < 12 HOURS Performed at [facility], [address], [place], [place] [address]    Report Status PENDING  Incomplete  CSF culture     Status: None (Preliminary result)   Collection Time: 05/11/20  9:50 AM   Specimen: CSF; Cerebrospinal Fluid  Result Value Ref Range Status   Specimen Description   Final    CSF Performed at [facility] [address] [place], [place] [address]    Special Requests   Final    NONE Performed at [facility], [address] [place], [place] [address]    Gram Stain   Final    WBC PRESENT, PREDOMINANTLY MONONUCLEAR NO ORGANISMS SEEN CYTOSPIN SMEAR Performed at [facility], [place] [address] [place], [place] [address]    Culture PENDING  Incomplete   Report Status PENDING  Incomplete  Gram stain     Status: None   Collection Time: 05/11/20  9:50 AM   Specimen: CSF; Cerebrospinal Fluid  Result Value Ref Range Status   Specimen Description CSF  Final   Special Requests NONE  Final   Gram Stain   Final    NO ORGANISMS SEEN Performed at [facility], [address] [place], [place]  [address]    Report Status 05/11/2020 FINAL  Final     Radiology Studies: CT Head Wo Contrast  Result Date: 05/10/2020 CLINICAL DATA:  Mental status change fever EXAM: CT HEAD WITHOUT CONTRAST TECHNIQUE: Contiguous axial images were obtained from the base of the skull through the vertex without intravenous contrast. COMPARISON:  None. FINDINGS: Brain: No evidence of acute infarction, hemorrhage, hydrocephalus, extra-axial collection or mass lesion/mass effect. Vascular: No hyperdense vessel or unexpected calcification. Skull: Normal. Negative for fracture or focal lesion. Sinuses/Orbits: No acute finding. Other: None. IMPRESSION: Negative non contrasted CT appearance of the brain. Electronically Signed   By: Donavan Foil M.D.   On: 05/10/2020 19:41   DG FLUORO GUIDED NEEDLE PLC ASPIRATION/INJECTION LOC  Result Date: 05/11/2020 CLINICAL DATA:  Headaches, stiff neck, question meningitis EXAM: DIAGNOSTIC LUMBAR PUNCTURE UNDER FLUOROSCOPIC GUIDANCE FLUOROSCOPY TIME:  Fluoroscopy Time:  3.98 minutes Radiation Exposure Index (if provided by the fluoroscopic device): 92 mGy Number of Acquired Spot Images: 3 PROCEDURE: Informed consent was obtained from the patient prior to the procedure, including potential complications of headache, allergy, and pain. With the patient prone, the lower back was prepped with Betadine. 1% Lidocaine was used for local anesthesia. Lumbar puncture was performed at the L3-L4 level using a 22 gauge needle. No CSF was obtained despite multiple attempts. Additional anesthesia was placed at the L2-L3 level and lumbar puncture was again attempted under fluoroscopic visualization. Difficult lumbar puncture, but eventually obtained clear colorless CSF with an opening pressure of 19 mL (measured prone). 9 ml of CSF were obtained for laboratory studies. The patient tolerated the procedure well and there were no apparent complications. IMPRESSION: Fluoroscopic guided lumbar puncture as above.  Electronically Signed   By: Elta Guadeloupe  Thornton Papas M.D.   On: 05/11/2020 11:29    Scheduled Meds: . heparin  5,000 Units Subcutaneous Q8H  . nicotine  14 mg Transdermal Daily  . nystatin  5 mL Oral QID  . pantoprazole  40 mg Oral Daily   Continuous Infusions: . acyclovir    . cefTRIAXone (ROCEPHIN)  IV Stopped (05/11/20 1122)  . vancomycin Stopped (05/11/20 1737)     LOS: 0 days    Time spent: 30 minutes.    Barton Dubois, MD [facility]   To contact the attending provider between 7A-7P or the covering provider during after hours 7P-7A, please log into the web site [email or web address] and access using universal [place] password for that web site. If you do not have the password, please call the hospital operator.  05/11/2020, 6:01 PM

## 2020-05-11 NOTE — Plan of Care (Signed)

## 2020-05-11 NOTE — ED Notes (Signed)
Patient transported to IR 

## 2020-05-12 DIAGNOSIS — R509 Fever, unspecified: Secondary | ICD-10-CM | POA: Diagnosis not present

## 2020-05-12 LAB — URINE CULTURE: Culture: NO GROWTH

## 2020-05-12 LAB — GLUCOSE, CAPILLARY
Glucose-Capillary: 231 mg/dL — ABNORMAL HIGH (ref 70–99)
Glucose-Capillary: 167 mg/dL — ABNORMAL HIGH (ref 70–99)

## 2020-05-12 LAB — PROCALCITONIN: Procalcitonin: 0.34 ng/mL

## 2020-05-12 MED ORDER — NYSTATIN 100000 UNIT/ML MT SUSP: 5.0000 mL | Freq: Four times a day (QID) | OROMUCOSAL | 0 refills | Status: AC

## 2020-05-12 MED ORDER — IBUPROFEN 800 MG PO TABS
800.0000 mg | ORAL_TABLET | Freq: Four times a day (QID) | ORAL | Status: DC | PRN
  Filled 2020-05-12: qty 1

## 2020-05-12 MED ORDER — ACYCLOVIR SODIUM 50 MG/ML IV SOLN
INTRAVENOUS | Status: AC
  Filled 2020-05-12: qty 10

## 2020-05-12 MED ORDER — DICLOFENAC SODIUM 75 MG PO TBEC: 75.0000 mg | DELAYED_RELEASE_TABLET | Freq: Two times a day (BID) | ORAL | 2 refills | Status: AC

## 2020-05-12 MED ORDER — ACETAMINOPHEN 500 MG PO TABS: 1000.0000 mg | ORAL_TABLET | Freq: Four times a day (QID) | ORAL | 0 refills | Status: AC | PRN

## 2020-05-12 MED ORDER — BACITRACIN ZINC 500 UNIT/GM EX OINT: TOPICAL_OINTMENT | CUTANEOUS | 0 refills | Status: AC

## 2020-05-12 NOTE — Progress Notes (Signed)
Nsg Discharge Note  Admit Date:  05/10/2020 Discharge date: 05/12/2020   [name] to be D/C'd home per MD order.  AVS completed.  Copy for chart, and copy for patient signed, and dated. Patient/caregiver able to verbalize understanding.  Discharge Medication: Allergies as of 05/12/2020      Reactions   Codeine Itching      Medication List    TAKE these medications   acetaminophen 500 MG tablet Commonly known as: TYLENOL Take 2 tablets (1,000 mg total) by mouth every 6 (six) hours as needed for mild pain. What changed: how much to take   albuterol 108 (90 Base) MCG/ACT inhaler Commonly known as: VENTOLIN HFA Inhale 1-2 puffs into the lungs every 6 (six) hours as needed for wheezing or shortness of breath.   bacitracin ointment Apply to affected area daily   baclofen 10 MG tablet Commonly known as: LIORESAL Take 1 tablet (10 mg total) by mouth 3 (three) times daily.   diclofenac 75 MG EC tablet Commonly known as: VOLTAREN Take 1 tablet (75 mg total) by mouth 2 (two) times daily. Take with food   dicyclomine 10 MG capsule Commonly known as: BENTYL Take 1 capsule (10 mg total) by mouth daily as needed for spasms.   nystatin 100000 UNIT/ML suspension Commonly known as: MYCOSTATIN Take 5 mLs (500,000 Units total) by mouth 4 (four) times daily for 5 days.   omeprazole 20 MG capsule Commonly known as: PRILOSEC Take 20 mg by mouth daily.       Discharge Assessment: Vitals:   05/11/20 2126 05/12/20 0639  BP:  130/73  Pulse:  68  Resp:  16  Temp:  97.7 F (36.5 C)  SpO2: 96% 97%   Skin clean, dry and intact without evidence of skin break down, no evidence of skin tears noted. IV catheter discontinued intact. Site without signs and symptoms of complications - no redness or edema noted at insertion site, patient denies c/o pain - only slight tenderness at site.  Dressing with slight pressure applied.  D/c Instructions-Education: Discharge instructions given to  patient/family with verbalized understanding. D/c education completed with patient/family including follow up instructions, medication list, d/c activities limitations if indicated, with other d/c instructions as indicated by MD - patient able to verbalize understanding, all questions fully answered. Patient instructed to return to ED, call 911, or call MD for any changes in condition.  Patient escorted via [place], and D/C home via private auto.  Felicie Morn, RN 05/12/2020 2:35 PM

## 2020-05-12 NOTE — TOC Initial Note (Addendum)
Transition of Care [facility]) - Initial/Assessment Note   Patient Details  Name: [name] MRN: [id number] Date of Birth: Aug 21, 1975  [facility]) CM/SW Contact:    Sherie Don, LCSW Phone Number: 05/12/2020, 12:11 PM  Clinical Narrative: Patient is a 44 year old female who was admitted for fever. Per chart review, patient does not have insurance. CSW spoke with patient and patient is agreeable to referral to financial counselor. CSW made referral to financial counselor, Tito Dine.  CSW set patient up with a PCP appointment at [facility] ([address] [place]  [address]; [phone]) for May 23, 2020 at [place] faxed to [facility] per clinic's request. TOC signing off.  Expected Discharge Plan: Home/Self Care Barriers to Discharge: Continued Medical Work up, Inadequate or no insurance  Expected Discharge Plan and Services Expected Discharge Plan: Home/Self Care In-house Referral: Development worker, community, Clinical Social Work Post Acute Care Choice: NA Living arrangements for the past 2 months: Single Family Home              DME Arranged: N/A DME Agency: NA HH Arranged: NA [place] Agency: NA  Prior Living Arrangements/Services Living arrangements for the past 2 months: [place] Patient language and need for interpreter reviewed:: Yes Do you feel safe going back to the place where you live?: Yes      Need for Family Participation in Patient Care: No (Comment) Care giver support system in place?: Yes (comment) Criminal Activity/Legal Involvement Pertinent to Current Situation/Hospitalization: No - Comment as needed  Activities of Daily Living Home Assistive Devices/Equipment: None ADL Screening (condition at time of admission) Patient's cognitive ability adequate to safely complete daily activities?: Yes Is the patient deaf or have difficulty hearing?: No Does the patient have difficulty seeing, even when wearing glasses/contacts?:  No Does the patient have difficulty concentrating, remembering, or making decisions?: No Patient able to express need for assistance with ADLs?: No Does the patient have difficulty dressing or bathing?: No Independently performs ADLs?: Yes (appropriate for developmental age) Does the patient have difficulty walking or climbing stairs?: No Weakness of Legs: None Weakness of Arms/Hands: None  Permission Sought/Granted Permission sought to share information with : Other (comment) Public house manager) Permission granted to share information with : Yes, Verbal Permission Granted Share Information with NAME: Financial counselor  Emotional Assessment Appearance:: Appears stated age Attitude/Demeanor/Rapport: Engaged Affect (typically observed): Accepting Orientation: : Oriented to Self, Oriented to Place, Oriented to  Time, Oriented to Situation Alcohol / Substance Use: Tobacco Use Psych Involvement: No (comment)  Admission diagnosis:  Fever in adult [R50.9] Fever [R50.9] Patient Active Problem List   Diagnosis Date Noted  . Fever 05/11/2020  . Fever in adult 05/10/2020   PCP:  Patient, No Pcp Per Pharmacy:  No Pharmacies Listed  Readmission Risk Interventions No flowsheet data found.

## 2020-05-12 NOTE — Discharge Summary (Signed)
Physician Discharge Summary  [name] ION:[id number] DOB: July 18, 1976 DOA: 05/10/2020  PCP: Patient, No Pcp Per  Admit date: 05/10/2020  Discharge date: 05/12/2020  Admitted From:Home  Disposition:  Home  Recommendations for Outpatient Follow-up:  1. Follow up with PCP as established in [place], [place] in 1-2 weeks 2. Remain on oral nystatin as prescribed for treatment of oral thrush for 5 days 3. Use bacitracin ointment as prescribed for lesion to lower extremity 4. Continue Tylenol and NSAID as recommended for pain control with no signs of meningitis noted during inpatient stay  Home Health: None  Equipment/Devices: None  Discharge Condition: Stable  CODE STATUS: Full  Diet recommendation: Heart Healthy  Brief/Interim Summary: As per H&P written by Dr. Clearence [name] on 05/10/20 [name] a44 y.o.female,with history of IBS and morbid obesity presents with chief complaint of headache. She reports that the pain feels like a vice grip around her head starting in the occipital region. She reports neck stiffness and weakness. She says her pain is worse if she turns her head left or right, and especially worse if she looks up. Patient reports that she can tilt her head back in the shower to rinse her hair. She reports that the pain in her neck feels like a pulled muscle feeling. Even opening her mouth makes the pain worse. This all started 4 weeks ago. She reports this is her fifth visit to provider in those 4 weeks. Patient [facility] and [facility] her with cat scratch fever. She had had a cat bite 5 days prior to the onset of the headache. She was placed on a Z-Pak for infected cat bite and cat scratch fever. She reports that she had no relief. Patient reports that she went to [place], and had x-rays was given a course of steroids. She then came [facility] and saw [name] gave her baclofen and diclofenac.Patient reports no  relief with that regimen,, but when speaking to the ED provider she reports that the patient had a lot of relief and fell asleep after the baclofen diclofenac. Patient reports that she [facility] today, who told her that she would need an LP and sent her to the ER. Patient reports that she has felt feverish, but has not measured a fever at home because she has been taking acetaminophen and Advil around-the-clock. Acetaminophen and Advil do not do anything to help her pain. Goody powder does give her some relief. Patient reports photophobia and phonophobia associated. She also reports emesis x3 on 1 day 4 days ago. The emesis was yellow and nonbloody. She reports diarrhea as well, but patient reports that she has IBS and she was has diarrhea or constipation. Patient has had a second cat bite, that one was 3-1/2 weeks ago. She reports that that one became infected as well. But it looks like it is healing normally at this time cat who has been her twice is due for his rabies vaccine. However the cat remains fine after these 3-1/2 weeks since last of bite.  Patient smokes a pack a day. She uses marijuana twice a day every day. Patient reports that she is using the same a trusted dealer for her marijuana. She denies any other illicit drugs.  Patient is not vaccinated for Covid because her girlfriend thinks that it will kill you.  Patient is full code  -Patient was admitted with suspicion of meningitis and has undergone fluoroscopic guided LP on 10/20.  1-fever/headache-meningitis  ruled out -May be related to musculoskeletal/myofascial pain and will need outpatient follow-up with PCP and even possibly neurology. -Lumbar puncture done and CSF analysis/culture with no growth or signs of bacterial infection -Empirically started on vancomycin, Rocephin and acyclovir) to cover for bacterial and aseptic meningitis. -patient also started on Decadron -All the antibiotics have been  discontinued after discussion with ID Dr. Prudencio [name] who has reviewed the case.  CSF essentially appears bland and patient has remarkable improvement in her clinical condition at this point in time. -Continue as needed analgesics and antipyretics over-the-counter for pain control as patient is hesitant to use anything stronger to include narcotics.  2-history of cat bite -Overall bite wound healing appropriately -Patient has been appropriately treated with no further need for antibiotic at this time -No signs of superimposed infection.  3-suppurativa hidradenitis -Continue current management -No signs of IND needs at this time -Warm compresses advised along with powders to use to keep skin dry -Bacitracin cream also prescribed  4-morbid obesity -Body mass index is 43.77 kg/m. -Low calorie diet, portion control and increase physical activity discussed.  5-tobacco abuse/marijuana use -Cessation counseling has been provided-nicotine patch has been ordered.  6-thrush -Will continue nystatin for 5 days  7-hyperglycemia -no prior hx of DM -Blood glucose currently controlled with A1c pending and will need outpatient follow-up  Discharge Diagnoses:  Active Problems:   Fever in adult   Fever    Discharge Instructions  Discharge Instructions    Diet - low sodium heart healthy   Complete by: As directed    Increase activity slowly   Complete by: As directed      Allergies as of 05/12/2020      Reactions   Codeine Itching      Medication List    TAKE these medications   acetaminophen 500 MG tablet Commonly known as: TYLENOL Take 1,500 mg by mouth every 6 (six) hours as needed for mild pain.   albuterol 108 (90 Base) MCG/ACT inhaler Commonly known as: VENTOLIN HFA Inhale 1-2 puffs into the lungs every 6 (six) hours as needed for wheezing or shortness of breath.   bacitracin ointment Apply to affected area daily   baclofen 10 MG tablet Commonly known as:  LIORESAL Take 1 tablet (10 mg total) by mouth 3 (three) times daily.   diclofenac 75 MG EC tablet Commonly known as: VOLTAREN Take 1 tablet (75 mg total) by mouth 2 (two) times daily. Take with food   dicyclomine 10 MG capsule Commonly known as: BENTYL Take 1 capsule (10 mg total) by mouth daily as needed for spasms.   nystatin 100000 UNIT/ML suspension Commonly known as: MYCOSTATIN Take 5 mLs (500,000 Units total) by mouth 4 (four) times daily for 5 days.   omeprazole 20 MG capsule Commonly known as: PRILOSEC Take 20 mg by mouth daily.       Follow-up Information    PCP in [place] Follow up in 1 week(s).              Allergies  Allergen Reactions  . Codeine Itching    Consultations:  ID on phone Dr. Gale Journey   Procedures/Studies: CT Head Wo Contrast  Result Date: 05/10/2020 CLINICAL DATA:  Mental status change fever EXAM: CT HEAD WITHOUT CONTRAST TECHNIQUE: Contiguous axial images were obtained from the base of the skull through the vertex without intravenous contrast. COMPARISON:  None. FINDINGS: Brain: No evidence of acute infarction, hemorrhage, hydrocephalus, extra-axial collection or mass lesion/mass effect. Vascular: No hyperdense vessel  or unexpected calcification. Skull: Normal. Negative for fracture or focal lesion. Sinuses/Orbits: No acute finding. Other: None. IMPRESSION: Negative non contrasted CT appearance of the brain. Electronically Signed   By: Donavan Foil M.D.   On: 05/10/2020 19:41   CT Cervical Spine Wo Contrast  Result Date: 05/02/2020 CLINICAL DATA:  Chronic neck pain EXAM: CT CERVICAL SPINE WITHOUT CONTRAST TECHNIQUE: Multidetector CT imaging of the cervical spine was performed without intravenous contrast. Multiplanar CT image reconstructions were also generated. COMPARISON:  X-ray 04/25/2004 FINDINGS: Alignment: Facet joints are aligned without dislocation or traumatic listhesis. Dens and lateral masses are aligned. Straightening of the cervical  lordosis. Skull base and vertebrae: No acute fracture. No primary bone lesion or focal pathologic process. Soft tissues and spinal canal: No prevertebral fluid or swelling. No visible canal hematoma. Disc levels: Intervertebral disc heights are preserved. No significant facet joint arthropathy. No evidence of stenosis by CT. Upper chest: Visualized lung apices are clear. Other: There is a mildly enlarged right level 2A lymph node measuring 10 mm short axis (series 4, image 46) which is slightly more hyperdense than would be expected measuring approximately 94 HU. IMPRESSION: 1. No acute fracture or traumatic listhesis of the cervical spine. 2. Mildly enlarged and slightly hyperdense right level 2A lymph node, nonspecific. Suggest short-term follow-up with ultrasound in 4-6 weeks to assess for interval change. Electronically Signed   By: Davina Poke D.O.   On: 05/02/2020 13:47   DG FLUORO GUIDED NEEDLE PLC ASPIRATION/INJECTION LOC  Result Date: 05/11/2020 CLINICAL DATA:  Headaches, stiff neck, question meningitis EXAM: DIAGNOSTIC LUMBAR PUNCTURE UNDER FLUOROSCOPIC GUIDANCE FLUOROSCOPY TIME:  Fluoroscopy Time:  3.98 minutes Radiation Exposure Index (if provided by the fluoroscopic device): 92 mGy Number of Acquired Spot Images: 3 PROCEDURE: Informed consent was obtained from the patient prior to the procedure, including potential complications of headache, allergy, and pain. With the patient prone, the lower back was prepped with Betadine. 1% Lidocaine was used for local anesthesia. Lumbar puncture was performed at the L3-L4 level using a 22 gauge needle. No CSF was obtained despite multiple attempts. Additional anesthesia was placed at the L2-L3 level and lumbar puncture was again attempted under fluoroscopic visualization. Difficult lumbar puncture, but eventually obtained clear colorless CSF with an opening pressure of 19 mL (measured prone). 9 ml of CSF were obtained for laboratory studies. The patient  tolerated the procedure well and there were no apparent complications. IMPRESSION: Fluoroscopic guided lumbar puncture as above. Electronically Signed   By: Lavonia Dana M.D.   On: 05/11/2020 11:29     Discharge Exam: Vitals:   05/11/20 2126 05/12/20 0639  BP:  130/73  Pulse:  68  Resp:  16  Temp:  97.7 F (36.5 C)  SpO2: 96% 97%   Vitals:   05/11/20 1514 05/11/20 1802 05/11/20 2126 05/12/20 0639  BP: 118/62 122/72  130/73  Pulse: 80 80  68  Resp: 20 20  16   Temp: 97.9 F (36.6 C) 98.3 F (36.8 C)  97.7 F (36.5 C)  TempSrc: Oral Oral  Oral  SpO2: 98% 99% 96% 97%  Weight:  118.4 kg    Height:  5\' 4"  (1.626 m)      General: Pt is alert, awake, not in acute distress, obese Cardiovascular: RRR, S1/S2 +, no rubs, no gallops Respiratory: CTA bilaterally, no wheezing, no rhonchi Abdominal: Soft, NT, ND, bowel sounds + Extremities: no edema, no cyanosis    The results of significant diagnostics from this hospitalization (including imaging,  microbiology, ancillary and laboratory) are listed below for reference.     Microbiology: Recent Results (from the past 240 hour(s))  Urine culture     Status: None   Collection Time: 05/10/20  7:13 PM   Specimen: Urine, Clean Catch  Result Value Ref Range Status   Specimen Description   Final    URINE, CLEAN CATCH Performed at [facility], [address] [place], [place] [address]    Special Requests   Final    NONE Performed at [facility], [address] [place], [place] [address]    Culture   Final    NO GROWTH Performed at [facility], [place] [address] [place], [place] [address]    Report Status 05/12/2020 FINAL  Final  Respiratory Panel by RT PCR (Flu A&B, Covid) - Nasopharyngeal Swab     Status: None   Collection Time: 05/10/20  7:14 PM   Specimen: Nasopharyngeal Swab  Result Value Ref Range Status   SARS Coronavirus 2 by RT PCR NEGATIVE NEGATIVE Final    Comment: (NOTE) SARS-CoV-2 target nucleic acids are NOT  DETECTED.  The SARS-CoV-2 RNA is generally detectable in upper respiratoy specimens during the acute phase of infection. The lowest concentration of SARS-CoV-2 viral copies this assay can detect is 131 copies/mL. A negative result does not preclude SARS-Cov-2 infection and should not be used as the sole basis for treatment or other patient management decisions. A negative result may occur with  improper specimen collection/handling, submission of specimen other than nasopharyngeal swab, presence of viral mutation(s) within the areas targeted by this assay, and inadequate number of viral copies (<131 copies/mL). A negative result must be combined with clinical observations, patient history, and epidemiological information. The expected result is Negative.  Fact Sheet for Patients:  PinkCheek.be  Fact Sheet for Healthcare Providers:  GravelBags.it  This test is no t yet approved or cleared by the [place] FDA and  has been authorized for detection and/or diagnosis of SARS-CoV-2 by FDA under an Emergency Use Authorization (EUA). This EUA will remain  in effect (meaning this test can be used) for the duration of the COVID-19 declaration under Section 564(b)(1) of the Act, 21 U.S.C. section 360bbb-3(b)(1), unless the authorization is terminated or revoked sooner.     Influenza A by PCR NEGATIVE NEGATIVE Final   Influenza B by PCR NEGATIVE NEGATIVE Final    Comment: (NOTE) The Xpert Xpress SARS-CoV-2/FLU/RSV assay is intended as an aid in  the diagnosis of influenza from Nasopharyngeal swab specimens and  should not be used as a sole basis for treatment. Nasal washings and  aspirates are unacceptable for Xpert Xpress SARS-CoV-2/FLU/RSV  testing.  Fact Sheet for Patients: PinkCheek.be  Fact Sheet for Healthcare Providers: GravelBags.it  This test is not yet  approved or cleared by the [place] FDA and  has been authorized for detection and/or diagnosis of SARS-CoV-2 by  FDA under an Emergency Use Authorization (EUA). This EUA will remain  in effect (meaning this test can be used) for the duration of the  Covid-19 declaration under Section 564(b)(1) of the Act, 21  U.S.C. section 360bbb-3(b)(1), unless the authorization is  terminated or revoked. Performed at [facility], [address] [place], [place] [address]   Culture, blood (routine x 2)     Status: None (Preliminary result)   Collection Time: 05/10/20  7:43 PM   Specimen: Right Antecubital; Blood  Result Value Ref Range Status   Specimen Description RIGHT ANTECUBITAL  Final   Special Requests   Final    BOTTLES DRAWN AEROBIC AND ANAEROBIC Blood Culture adequate volume   Culture   Final    NO GROWTH 2 DAYS Performed at [facility], [address] [place], [place] [address]    Report Status PENDING  Incomplete  Culture, blood (routine x 2)     Status: None (Preliminary result)   Collection Time: 05/10/20  7:43 PM   Specimen: BLOOD RIGHT HAND  Result Value Ref Range Status   Specimen Description BLOOD RIGHT HAND  Final   Special Requests   Final    BOTTLES DRAWN AEROBIC AND ANAEROBIC Blood Culture adequate volume   Culture   Final    NO GROWTH 2 DAYS Performed at [facility], [address], [place], [place] [address]    Report Status PENDING  Incomplete  CSF culture     Status: None (Preliminary result)   Collection Time: 05/11/20  9:50 AM   Specimen: CSF; Cerebrospinal Fluid  Result Value Ref Range Status   Specimen Description   Final    CSF Performed at [place] [address] [place], [place] [address]    Special Requests   Final    NONE Performed at [facility], [address], [place], [place] [address]    Gram Stain   Final    WBC PRESENT, PREDOMINANTLY MONONUCLEAR NO ORGANISMS SEEN CYTOSPIN SMEAR    Culture   Final    NO GROWTH < 24  HOURS Performed at [facility], [place] [address] [place], [place] [address]    Report Status PENDING  Incomplete  Gram stain     Status: None   Collection Time: 05/11/20  9:50 AM   Specimen: CSF; Cerebrospinal Fluid  Result Value Ref Range Status   Specimen Description CSF  Final   Special Requests NONE  Final   Gram Stain   Final    NO ORGANISMS SEEN Performed at [facility], [address] [place], [place] [address]    Report Status 05/11/2020 FINAL  Final     Labs: BNP (last 3 results) No results for input(s): BNP in the last 8760 hours. Basic Metabolic Panel: Recent Labs  Lab 05/10/20 1909 05/11/20 0235  NA 137 137  K 3.1* 3.9  CL 102 105  CO2 25 23  GLUCOSE 153* 159*  BUN 12 11  CREATININE 0.83 0.79  CALCIUM 8.8* 8.7*  MG  --  2.1   Liver Function Tests: Recent Labs  Lab 05/10/20 1918 05/11/20 0235  AST 13* 19  ALT 24 25  ALKPHOS 99 98  BILITOT 0.3 0.4  PROT 7.4 6.8  ALBUMIN 3.6 3.1*   No results for input(s): LIPASE, AMYLASE in the last 168 hours. No results for input(s): AMMONIA in the last 168 hours. CBC: Recent Labs  Lab 05/10/20 1909 05/11/20 0235  WBC 8.3 8.0  NEUTROABS 4.8 5.9  HGB 14.1 13.4  HCT 42.3 40.9  MCV 91.2 93.4  PLT 365 339   Cardiac Enzymes: No results for input(s): CKTOTAL, CKMB, CKMBINDEX, TROPONINI in the last 168 hours. BNP: Invalid input(s): POCBNP CBG: Recent Labs  Lab 05/11/20 0750 05/11/20 1138 05/11/20 2133 05/12/20 0811 05/12/20 1231  GLUCAP 176* 177* 181* 231* 167*   D-Dimer No results for input(s): DDIMER in the last 72 hours. Hgb A1c No results for input(s): HGBA1C in the last 72 hours. Lipid Profile No results for input(s): CHOL, HDL, LDLCALC, TRIG, CHOLHDL, LDLDIRECT in the  last 72 hours. Thyroid function studies Recent Labs    05/10/20 1909  TSH 4.082   Anemia work up No results for input(s): VITAMINB12, FOLATE, FERRITIN, TIBC, IRON, RETICCTPCT in the last 72 hours. Urinalysis     Component Value Date/Time   COLORURINE YELLOW 05/10/2020 1913   APPEARANCEUR CLEAR 05/10/2020 1913   LABSPEC 1.025 05/10/2020 1913   PHURINE 6.0 05/10/2020 1913   GLUCOSEU NEGATIVE 05/10/2020 1913   HGBUR MODERATE (A) 05/10/2020 1913   BILIRUBINUR NEGATIVE 05/10/2020 1913   KETONESUR NEGATIVE 05/10/2020 1913   PROTEINUR TRACE (A) 05/10/2020 1913   UROBILINOGEN 0.2 05/18/2008 1439   NITRITE NEGATIVE 05/10/2020 1913   LEUKOCYTESUR NEGATIVE 05/10/2020 1913   Sepsis Labs Invalid input(s): PROCALCITONIN,  WBC,  LACTICIDVEN Microbiology Recent Results (from the past 240 hour(s))  Urine culture     Status: None   Collection Time: 05/10/20  7:13 PM   Specimen: Urine, Clean Catch  Result Value Ref Range Status   Specimen Description   Final    URINE, CLEAN CATCH Performed at [facility], [address], [place], [place] [address]    Special Requests   Final    NONE Performed at [facility], [address] [place], [place] [address]    Culture   Final    NO GROWTH Performed at [facility], [place] [address], [place], [place] [address]    Report Status 05/12/2020 FINAL  Final  Respiratory Panel by RT PCR (Flu A&B, Covid) - Nasopharyngeal Swab     Status: None   Collection Time: 05/10/20  7:14 PM   Specimen: Nasopharyngeal Swab  Result Value Ref Range Status   SARS Coronavirus 2 by RT PCR NEGATIVE NEGATIVE Final    Comment: (NOTE) SARS-CoV-2 target nucleic acids are NOT DETECTED.  The SARS-CoV-2 RNA is generally detectable in upper respiratoy specimens during the acute phase of infection. The lowest concentration of SARS-CoV-2 viral copies this assay can detect is 131 copies/mL. A negative result does not preclude SARS-Cov-2 infection and should not be used as the sole basis for treatment or other patient management decisions. A negative result may occur with  improper specimen collection/handling, submission of specimen other than nasopharyngeal swab, presence of viral  mutation(s) within the areas targeted by this assay, and inadequate number of viral copies (<131 copies/mL). A negative result must be combined with clinical observations, patient history, and epidemiological information. The expected result is Negative.  Fact Sheet for Patients:  PinkCheek.be  Fact Sheet for Healthcare Providers:  GravelBags.it  This test is no t yet approved or cleared by the [place] FDA and  has been authorized for detection and/or diagnosis of SARS-CoV-2 by FDA under an Emergency Use Authorization (EUA). This EUA will remain  in effect (meaning this test can be used) for the duration of the COVID-19 declaration under Section 564(b)(1) of the Act, 21 U.S.C. section 360bbb-3(b)(1), unless the authorization is terminated or revoked sooner.     Influenza A by PCR NEGATIVE NEGATIVE Final   Influenza B by PCR NEGATIVE NEGATIVE Final    Comment: (NOTE) The Xpert Xpress SARS-CoV-2/FLU/RSV assay is intended as an aid in  the diagnosis of influenza from Nasopharyngeal swab specimens and  should not be used as a sole basis for treatment. Nasal washings and  aspirates are unacceptable for Xpert Xpress SARS-CoV-2/FLU/RSV  testing.  Fact Sheet for Patients: PinkCheek.be  Fact Sheet for Healthcare Providers: GravelBags.it  This test is not yet approved or cleared by the [place]  States FDA and  has been authorized for detection and/or diagnosis of SARS-CoV-2 by  FDA under an Emergency Use Authorization (EUA). This EUA will remain  in effect (meaning this test can be used) for the duration of the  Covid-19 declaration under Section 564(b)(1) of the Act, 21  U.S.C. section 360bbb-3(b)(1), unless the authorization is  terminated or revoked. Performed at [facility], [address], [place], [place] [address]   Culture, blood (routine x 2)     Status:  None (Preliminary result)   Collection Time: 05/10/20  7:43 PM   Specimen: Right Antecubital; Blood  Result Value Ref Range Status   Specimen Description RIGHT ANTECUBITAL  Final   Special Requests   Final    BOTTLES DRAWN AEROBIC AND ANAEROBIC Blood Culture adequate volume   Culture   Final    NO GROWTH 2 DAYS Performed at [facility], [address] [place], [place] [address]    Report Status PENDING  Incomplete  Culture, blood (routine x 2)     Status: None (Preliminary result)   Collection Time: 05/10/20  7:43 PM   Specimen: BLOOD RIGHT HAND  Result Value Ref Range Status   Specimen Description BLOOD RIGHT HAND  Final   Special Requests   Final    BOTTLES DRAWN AEROBIC AND ANAEROBIC Blood Culture adequate volume   Culture   Final    NO GROWTH 2 DAYS Performed at [facility], [address] [place], [place] [address]    Report Status PENDING  Incomplete  CSF culture     Status: None (Preliminary result)   Collection Time: 05/11/20  9:50 AM   Specimen: CSF; Cerebrospinal Fluid  Result Value Ref Range Status   Specimen Description   Final    CSF Performed at [place] [address] [place], [place] [address]    Special Requests   Final    NONE Performed at [facility], [address] [place], [place] [address]    Gram Stain   Final    WBC PRESENT, PREDOMINANTLY MONONUCLEAR NO ORGANISMS SEEN CYTOSPIN SMEAR    Culture   Final    NO GROWTH < 24 HOURS Performed at [facility], [place] [address] [place], [place] [address]    Report Status PENDING  Incomplete  Gram stain     Status: None   Collection Time: 05/11/20  9:50 AM   Specimen: CSF; Cerebrospinal Fluid  Result Value Ref Range Status   Specimen Description CSF  Final   Special Requests NONE  Final   Gram Stain   Final    NO ORGANISMS SEEN Performed at [facility], [address] [place],  [address]    Report Status 05/11/2020 FINAL  Final     Time coordinating discharge: 35  minutes  SIGNED:   Rodena Goldmann, DO [facility] 05/12/2020, 1:25 PM  If 7PM-7AM, please contact night-coverage [email or web address]

## 2020-05-13 LAB — HSV 1/2 PCR, CSF
HSV-1 DNA: NEGATIVE
HSV-2 DNA: NEGATIVE

## 2020-05-13 LAB — HEMOGLOBIN A1C
Hgb A1c MFr Bld: 6.1 % — ABNORMAL HIGH (ref 4.8–5.6)
Mean Plasma Glucose: 128 mg/dL

## 2020-05-14 LAB — CSF CULTURE W GRAM STAIN: Culture: NO GROWTH

## 2020-05-14 LAB — HSV CULTURE AND TYPING

## 2020-05-15 LAB — CULTURE, BLOOD (ROUTINE X 2)
Culture: NO GROWTH
Culture: NO GROWTH
Special Requests: ADEQUATE
Special Requests: ADEQUATE

## 2020-09-04 ENCOUNTER — Emergency Department (HOSPITAL_COMMUNITY)
Admission: EM | Admit: 2020-09-04 | Discharge: 2020-09-04 | Disposition: A | Discharge status: Home / Self Care | Payer: Medicaid - Out of State | Attending: Emergency Medicine | Admitting: Emergency Medicine

## 2020-09-04 ENCOUNTER — Emergency Department (HOSPITAL_COMMUNITY): Payer: Medicaid - Out of State

## 2020-09-04 ENCOUNTER — Encounter (HOSPITAL_COMMUNITY): Payer: Self-pay | Admitting: Emergency Medicine

## 2020-09-04 ENCOUNTER — Other Ambulatory Visit: Payer: Self-pay

## 2020-09-04 DIAGNOSIS — Z79899 Other long term (current) drug therapy: Secondary | ICD-10-CM | POA: Insufficient documentation

## 2020-09-04 DIAGNOSIS — M25472 Effusion, left ankle: Secondary | ICD-10-CM

## 2020-09-04 DIAGNOSIS — L02612 Cutaneous abscess of left foot: Secondary | ICD-10-CM | POA: Diagnosis not present

## 2020-09-04 DIAGNOSIS — M25572 Pain in left ankle and joints of left foot: Secondary | ICD-10-CM | POA: Diagnosis not present

## 2020-09-04 DIAGNOSIS — F17210 Nicotine dependence, cigarettes, uncomplicated: Secondary | ICD-10-CM | POA: Insufficient documentation

## 2020-09-04 DIAGNOSIS — M79672 Pain in left foot: Secondary | ICD-10-CM | POA: Diagnosis present

## 2020-09-04 LAB — CBC WITH DIFFERENTIAL/PLATELET
Abs Immature Granulocytes: 0.29 10*3/uL — ABNORMAL HIGH (ref 0.00–0.07)
Basophils Absolute: 0.1 10*3/uL (ref 0.0–0.1)
Basophils Relative: 0 %
Eosinophils Absolute: 0 10*3/uL (ref 0.0–0.5)
Eosinophils Relative: 0 %
HCT: 47.5 % — ABNORMAL HIGH (ref 36.0–46.0)
Hemoglobin: 15.7 g/dL — ABNORMAL HIGH (ref 12.0–15.0)
Immature Granulocytes: 2 %
Lymphocytes Relative: 22 %
Lymphs Abs: 3.4 10*3/uL (ref 0.7–4.0)
MCH: 30.5 pg (ref 26.0–34.0)
MCHC: 33.1 g/dL (ref 30.0–36.0)
MCV: 92.4 fL (ref 80.0–100.0)
Monocytes Absolute: 0.7 10*3/uL (ref 0.1–1.0)
Monocytes Relative: 4 %
Neutro Abs: 10.8 10*3/uL — ABNORMAL HIGH (ref 1.7–7.7)
Neutrophils Relative %: 72 %
Platelets: 400 10*3/uL (ref 150–400)
RBC: 5.14 MIL/uL — ABNORMAL HIGH (ref 3.87–5.11)
RDW: 13.7 % (ref 11.5–15.5)
WBC: 15.2 10*3/uL — ABNORMAL HIGH (ref 4.0–10.5)
nRBC: 0 % (ref 0.0–0.2)

## 2020-09-04 LAB — BASIC METABOLIC PANEL
Anion gap: 9 (ref 5–15)
BUN: 17 mg/dL (ref 6–20)
CO2: 29 mmol/L (ref 22–32)
Calcium: 9.4 mg/dL (ref 8.9–10.3)
Chloride: 99 mmol/L (ref 98–111)
Creatinine, Ser: 0.93 mg/dL (ref 0.44–1.00)
GFR, Estimated: >60 mL/min (ref >60)
Glucose, Bld: 132 mg/dL — ABNORMAL HIGH (ref 70–99)
Potassium: 4.5 mmol/L (ref 3.5–5.1)
Sodium: 137 mmol/L (ref 135–145)

## 2020-09-04 LAB — C-REACTIVE PROTEIN: CRP: 0.7 mg/dL (ref <1.0)

## 2020-09-04 LAB — SEDIMENTATION RATE: Sed Rate: 5 mm/hr (ref 0–22)

## 2020-09-04 NOTE — ED Provider Notes (Signed)
[facility] EMERGENCY DEPARTMENT Provider Note   CSN: [id number] Arrival date & time: 09/04/20  1130     History Chief Complaint  Patient presents with  . Foot Pain    [name] is a 45 y.o. female with no relevant past medical history presents the ED for MRI.  On my examination, patient reports that she first noticed swelling involving the medial aspect of her left ankle 4 weeks ago.  She was seen by a primary care provider who suspected that it was gout and treated her with colchicine and allopurinol.  She then went to an orthopedist last week given lack of improvement and was informed that her history and exam is very uncharacteristic for acute gout flare.  They are advising MRI, but they are waiting for authorization from her insurance company before they can schedule it.  Additionally, patient had been evaluated for an infection involving her great toe on the same foot and has been treated with doxycycline and cefdinir.  She also had incision and drainage performed by the orthopedist, but is concerned given lack necrotic appearance.  Patient showed me a picture and appeared to be a felon versus paronychia.  She tells me that she has not been ambulatory since her onset of ankle pain 4 weeks ago.  She states that she has diminished range of motion and pain with any flexion and extension.  She denies any fevers, chills, streaking erythema, numbness or weakness, history of IVDA, concern for disseminated gonococcal infection or other STI, history of similar, or other symptoms.  She was told at one point that she was prediabetic, but her A1c was within normal limits.  Her only medical history is IBS and GERD.  Her partner is at bedside.  HPI     Past Medical History:  Diagnosis Date  . IBS (irritable bowel syndrome) 1994    Patient Active Problem List   Diagnosis Date Noted  . Fever 05/11/2020  . Fever in adult 05/10/2020    Past Surgical History:  Procedure Laterality Date   . CESAREAN SECTION    . CHOLECYSTECTOMY       OB History    Gravida  3   Para  3   Term  2   Preterm  1   AB      Living        SAB      IAB      Ectopic      Multiple      Live Births              Family History  Problem Relation Age of Onset  . Diabetes Mother   . Crohn's disease Mother   . Diabetes Sister   . Hypertension Sister   . Diabetes Other     Social History   Tobacco Use  . Smoking status: Current Every Day Smoker    Packs/day: 0.50    Types: Cigarettes  . Smokeless tobacco: Never Used  Vaping Use  . Vaping Use: Never used  Substance Use Topics  . Alcohol use: No  . Drug use: Yes    Types: Marijuana    Home Medications Prior to Admission medications   Medication Sig Start Date End Date Taking? Authorizing Provider  acetaminophen (TYLENOL) 500 MG tablet Take 2 tablets (1,000 mg total) by mouth every 6 (six) hours as needed for mild pain. 05/12/20  Yes Shah, Pratik D, DO  albuterol (PROVENTIL HFA;VENTOLIN HFA) 108 (90 Base) MCG/ACT inhaler  Inhale 1-2 puffs into the lungs every 6 (six) hours as needed for wheezing or shortness of breath.   Yes [provider]  allopurinol (ZYLOPRIM) 300 MG tablet Take 300 mg by mouth daily. 08/15/20  Yes [provider]  baclofen (LIORESAL) 10 MG tablet Take 1 tablet (10 mg total) by mouth 3 (three) times daily. 05/02/20  Yes Triplett, Tammy, PA-C  Colchicine 0.6 MG CAPS Take 1 capsule by mouth 2 (two) times daily. 08/23/20  Yes [provider]  omeprazole (PRILOSEC) 20 MG capsule Take 20 mg by mouth daily.   Yes [provider]  predniSONE (STERAPRED UNI-PAK 48 TAB) 10 MG (48) TBPK tablet Take 10 mg by mouth See admin instructions. see package 08/29/20  Yes [provider]  bacitracin ointment Apply to affected area daily 05/12/20 05/12/21  Manuella Ghazi, Pratik D, DO  diclofenac (VOLTAREN) 75 MG EC tablet Take 1 tablet (75 mg total) by mouth 2 (two) times daily. Take with  food 05/12/20   Manuella Ghazi, Pratik D, DO  dicyclomine (BENTYL) 10 MG capsule Take 1 capsule (10 mg total) by mouth daily as needed for spasms. 01/08/20   Domenic Moras, PA-C    Allergies    Amoxicillin and Codeine  Review of Systems   Review of Systems  All other systems reviewed and are negative.   Physical Exam Updated Vital Signs BP 134/86   Pulse 65   Temp 98.1 F (36.7 C) (Oral)   Resp 20   Ht 5\' 4"  (1.626 m)   Wt 111.1 kg   SpO2 96%   BMI 42.05 kg/m   Physical Exam Vitals and nursing note reviewed. Exam conducted with a chaperone present.  Constitutional:      General: She is not in acute distress.    Appearance: She is not toxic-appearing.  HENT:     Head: Normocephalic and atraumatic.  Eyes:     General: No scleral icterus.    Conjunctiva/sclera: Conjunctivae normal.  Pulmonary:     Effort: Pulmonary effort is normal.  Musculoskeletal:        General: Swelling and tenderness present.     Right lower leg: No edema.     Left lower leg: No edema.     Comments: Left ankle: Flexion and extension limited due to pain symptoms.  4 x 3 cm area of ecchymoses over medial malleolus that is exquisitely tender to palpation.  No significant fluctuance noted.  No crepitus.  No spreading erythema.  No induration. Left foot: Pedal pulse intact.  Capillary refill less than 2 seconds.  Can wiggle toes. Left great toe: Discoloration involving distal tip of great toe and immediately medial to nail plate.  No significant tenderness.  Capillary refill less than 2 seconds.  Can flex IP joint.  Appears to be improved when compared to pictures provided prior to incision and drainage.  Skin:    General: Skin is dry.  Neurological:     Mental Status: She is alert and oriented to person, place, and time.     GCS: GCS eye subscore is 4. GCS verbal subscore is 5. GCS motor subscore is 6.  Psychiatric:        Mood and Affect: Mood normal.        Behavior: Behavior normal.        Thought Content:  Thought content normal.          ED Results / Procedures / Treatments   Labs (all labs ordered are listed, but only abnormal  results are displayed) Labs Reviewed  CBC WITH DIFFERENTIAL/PLATELET - Abnormal; Notable for the following components:      Result Value   WBC 15.2 (*)    RBC 5.14 (*)    Hemoglobin 15.7 (*)    HCT 47.5 (*)    Neutro Abs 10.8 (*)    Abs Immature Granulocytes 0.29 (*)    All other components within normal limits  BASIC METABOLIC PANEL - Abnormal; Notable for the following components:   Glucose, Bld 132 (*)    All other components within normal limits  SEDIMENTATION RATE  C-REACTIVE PROTEIN    EKG None  Radiology DG Ankle Complete Left  Result Date: 09/04/2020 CLINICAL DATA:  Ankle swelling, pain and bruising EXAM: LEFT ANKLE COMPLETE - 3+ VIEW COMPARISON:  07/20/2013 FINDINGS: Medial soft tissue swelling noted. Tiny ossifications along the medial malleolus, suspect remote avulsions. No definite acute osseous finding or fracture. Normal alignment. No other joint abnormality. Calcaneal spur. Enthesopathic change at the Achilles insertion. IMPRESSION: Medial soft tissue swelling without acute osseous finding or malalignment. Electronically Signed   By: Jerilynn Mages.  Shick M.D.   On: 09/04/2020 13:48   DG Toe Great Left  Result Date: 09/04/2020 CLINICAL DATA:  Bruising, pain EXAM: LEFT GREAT TOE COMPARISON:  None. FINDINGS: Some degree of minor soft tissue swelling. No acute osseous finding or fracture. Normal alignment. No subluxation or dislocation. No joint abnormality. IMPRESSION: Soft tissue swelling without acute osseous finding Electronically Signed   By: Jerilynn Mages.  Shick M.D.   On: 09/04/2020 13:46    Procedures Procedures   Medications Ordered in ED Medications - No data to display  ED Course  I have reviewed the triage vital signs and the nursing notes.  Pertinent labs & imaging results that were available during my care of the patient were reviewed by me  and considered in my medical decision making (see chart for details).    MDM Rules/Calculators/A&P                          [name] was evaluated in Emergency Department on 09/04/2020 for the symptoms described in the history of present illness. She was evaluated in the context of the global COVID-19 pandemic, which necessitated consideration that the patient might be at risk for infection with the SARS-CoV-2 virus that causes COVID-19. Institutional protocols and algorithms that pertain to the evaluation of patients at risk for COVID-19 are in a state of rapid change based on information released by regulatory bodies including the CDC and federal and state organizations. These policies and algorithms were followed during the patient's care in the ED.  I personally reviewed patient's medical chart and all notes from triage and staff during today's encounter. I have also ordered and reviewed all labs and imaging that I felt to be medically necessary in the evaluation of this patient's complaints and with consideration of their physical exam. If needed, translation services were available and utilized.   On subsequent evaluation, patient states that she developed her left ankle discomfort prior to onset of her swelling overlying ecchymoses.  She states that she has been prescribed multiple antibiotics without relief.  She has also been prescribed multiple rounds of steroids and she is currently finishing up her second taper.  She is taking NSAIDs for pain relief.  Her paronychia/felon involving left great toe began a week after her onset of left ankle swelling and bruising.  It was drained just last week.  The distal area of necrotic appearing tissue is more likely bruising/old blood.  Her pedal pulse is intact and symmetric with contralateral leg.  Sensation intact throughout.  Low suspicion for vascular abnormalities.  Etiology of her atraumatic ankle discomfort is unclear.  Patient states that the  uric acid levels were unremarkable.  She denies any concerning history such as IVDA or concern for disseminated STI.  Does not appear to be vascular.  Denies chest pain or shortness of breath.  She has been afebrile.  No evidence to suggest systemic illness at this time.  Her elevated WBC to 15.2 is likely reflective of her ongoing steroid use.  Inflammatory markers are within normal limits.  Lower suspicion for osteomyelitis at this time.  She can still dorsiflex and plantarflex her left foot mildly against resistance. She will follow up with her orthopedist for ongoing evaluation and management.  Do not feel as though any emergent intervention is warranted.  She is already received multiple rounds of antibiotics do not feel as though any more are warranted.  She already has prescribed NSAIDs and pain medication.  No new medications today.  Discussed case with Dr. Eulis Foster who agrees with assessment and plan.  Patient and her partner voiced understanding and are agreeable with plan.  Final Clinical Impression(s) / ED Diagnoses Final diagnoses:  Pain and swelling of left ankle    Rx / DC Orders ED Discharge Orders    None       Corena Herter, PA-C 09/04/20 1542    Daleen Bo, MD 09/05/20 [phone]

## 2020-09-04 NOTE — Discharge Instructions (Addendum)
Your work-up today was reassuring.  X-rays without any bony abnormalities or concern for bony destruction/bone infection.    Your pulses are intact and I have low suspicion for vascular disease.  No evidence to suggest continued/worsening infection at this time.  Do not feel as though additional antibiotics would be of any help.   Your inflammatory markers are normal.  The cause for your pain and swelling is unclear.  Please follow-up with your orthopedist for ongoing evaluation and management.

## 2020-09-04 NOTE — ED Triage Notes (Signed)
Pt to the ED for evaluation of left foot pain. Pt states she has been treated for the past 4 weeks for gout. and another physician wanted an MRI to rule out vascular irregularity.  Pt has pedal pulses intact, and they have been dopplared and marked. Pt has abnormality to the inside of her left foot/ ankle region as well as her great left toe.

## 2020-09-04 NOTE — ED Notes (Signed)
Black area noted to top of left great toe, pt stated no injury prior to discoloration. Tenderness and pain noted on top of bottom of foot. Ankle noted to have blister on top of area with fluid. Pt has been to several [facility] and they are waiting on Insurance for MRI.

## 2021-09-17 IMAGING — RF DG FLUORO GUIDE NDL PLC/BX
3 series · 3 of 3 positions shown · non-contrast
Comparison: none

CLINICAL DATA: Headaches, stiff neck, question meningitis

[Series 1: cp_standard · 0.17mm/px · 1 of 1 slices shown (1 of 2)]
[im 1/1]
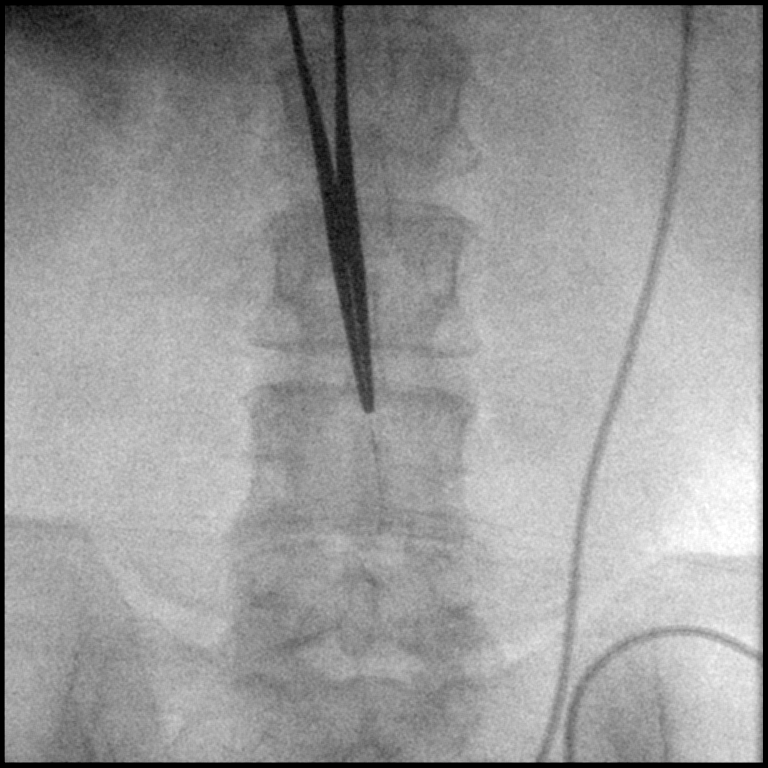

[Series 2: x lumbar spine lat · 0.15mm/px · 1 of 1 slices shown]
[im 1/1]
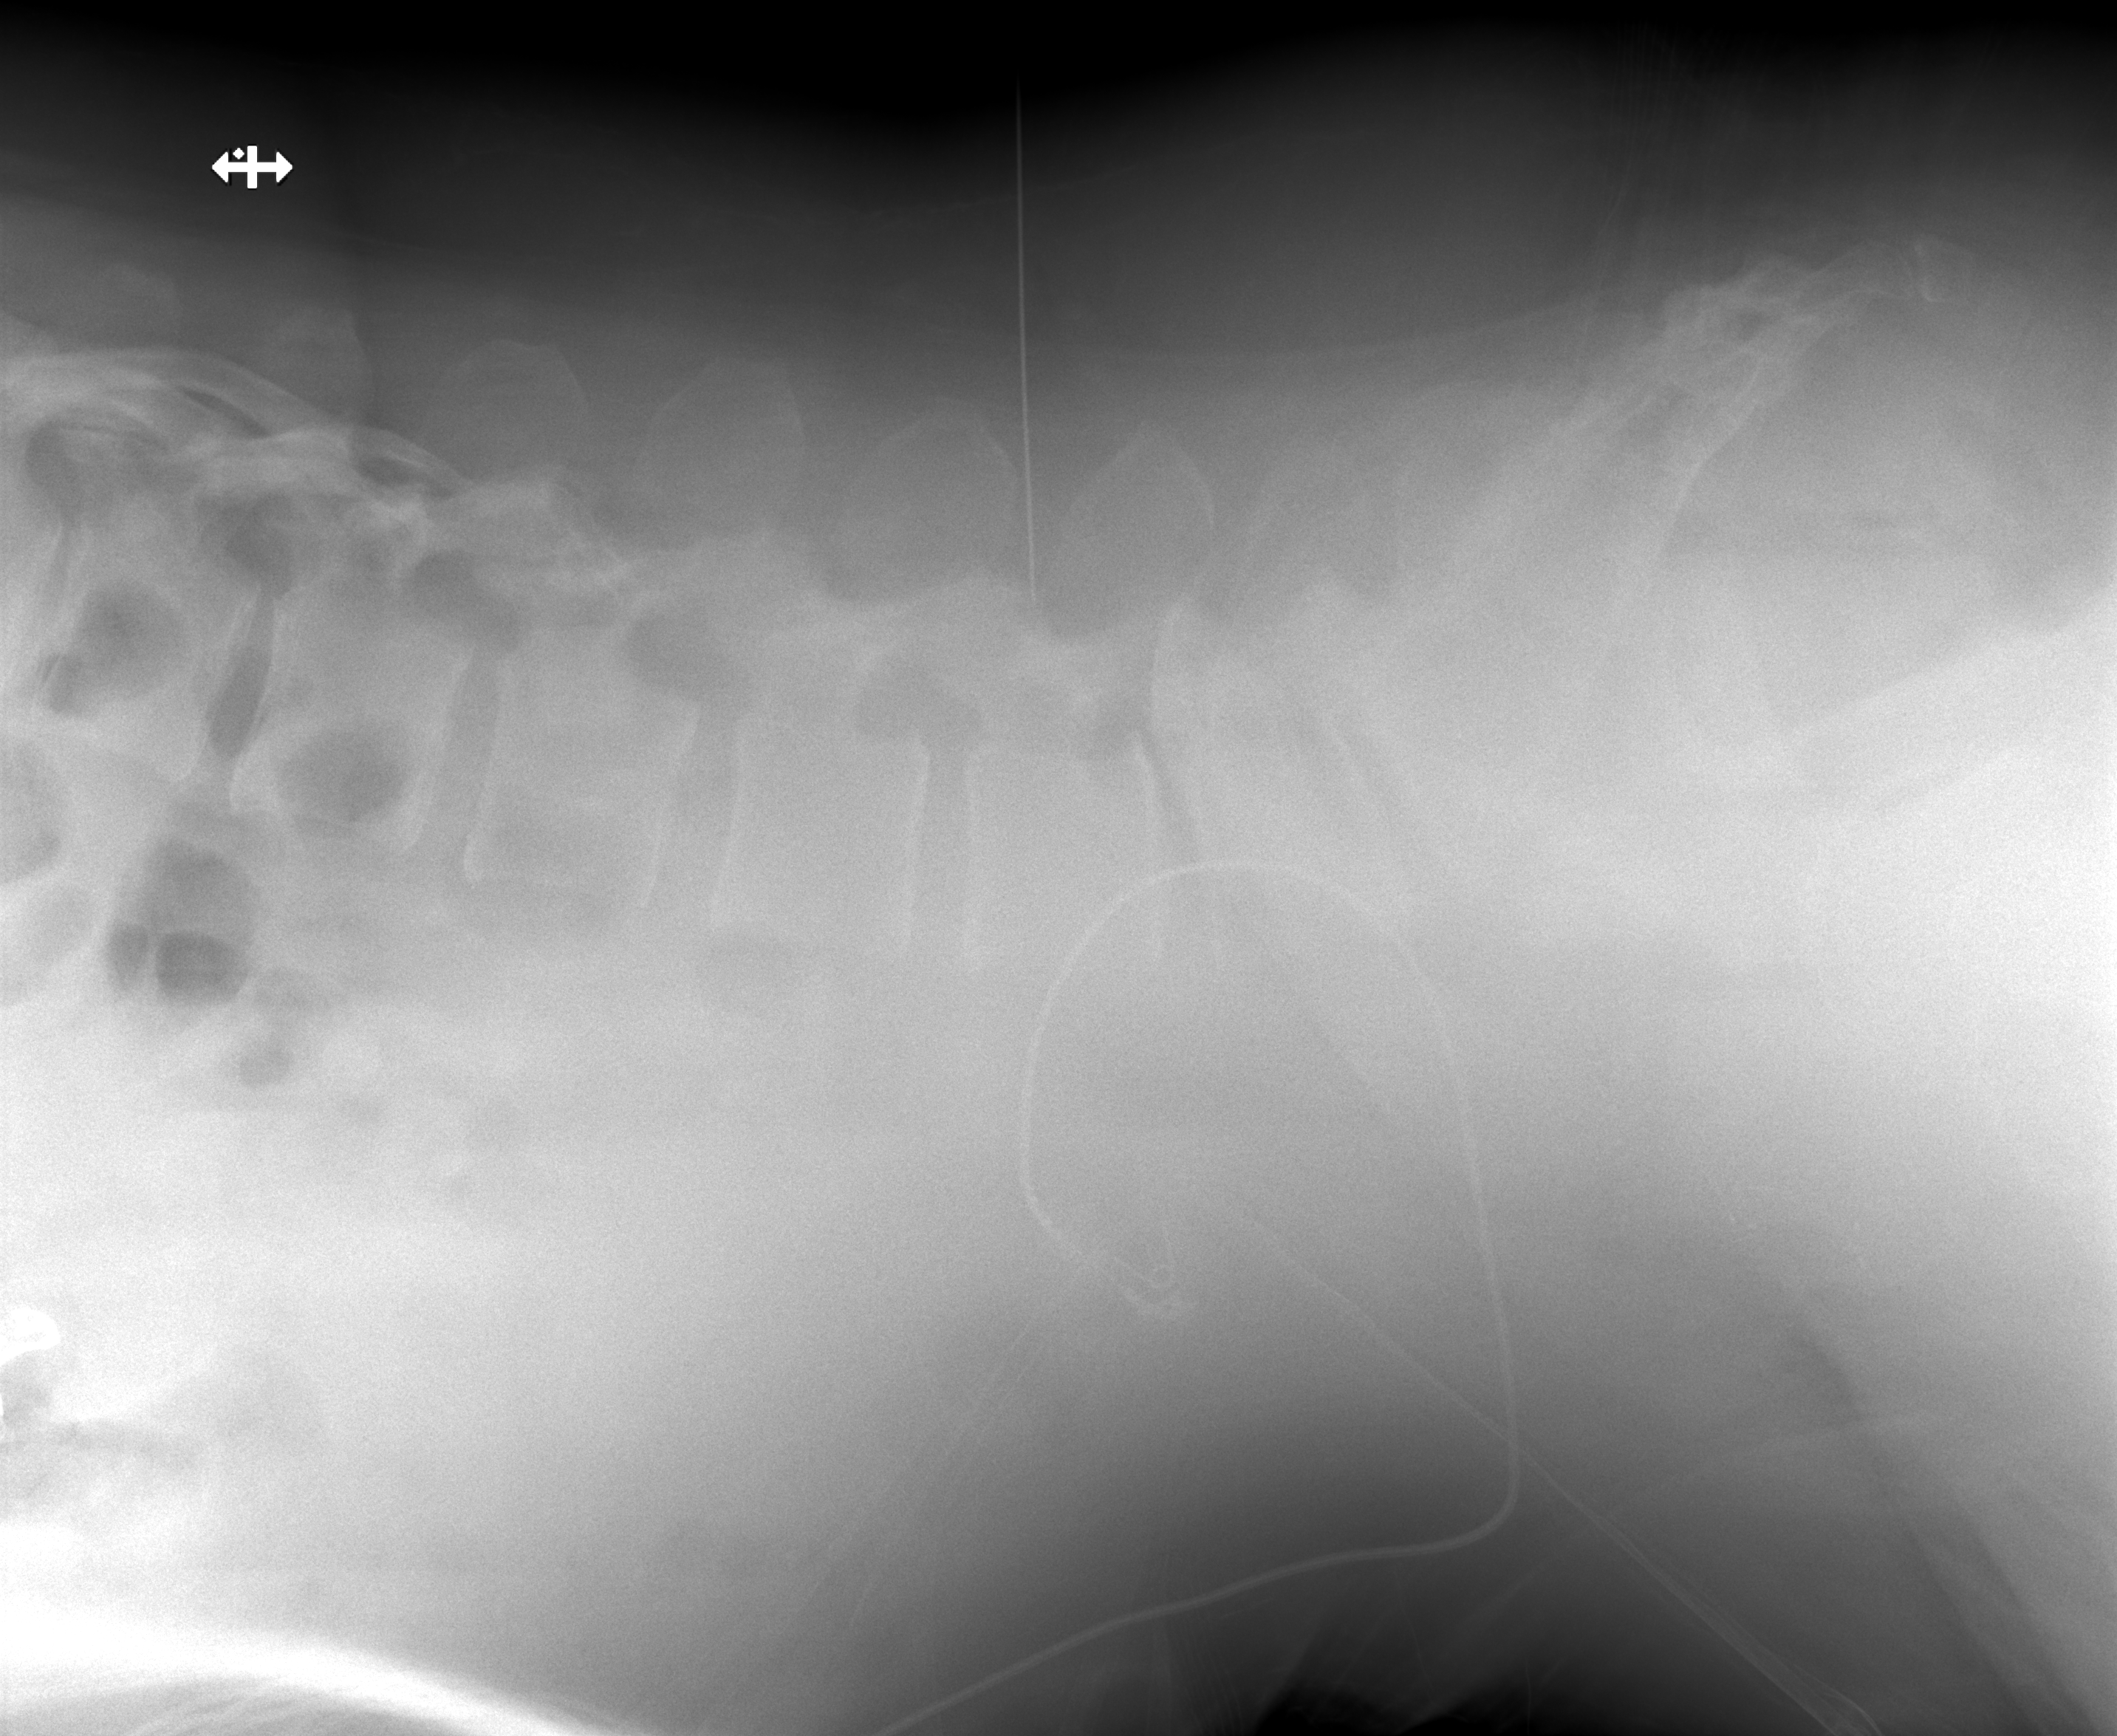

[Series 3: cp_standard · 0.18mm/px · 1 of 1 slices shown (2 of 2)]
[im 1/1]
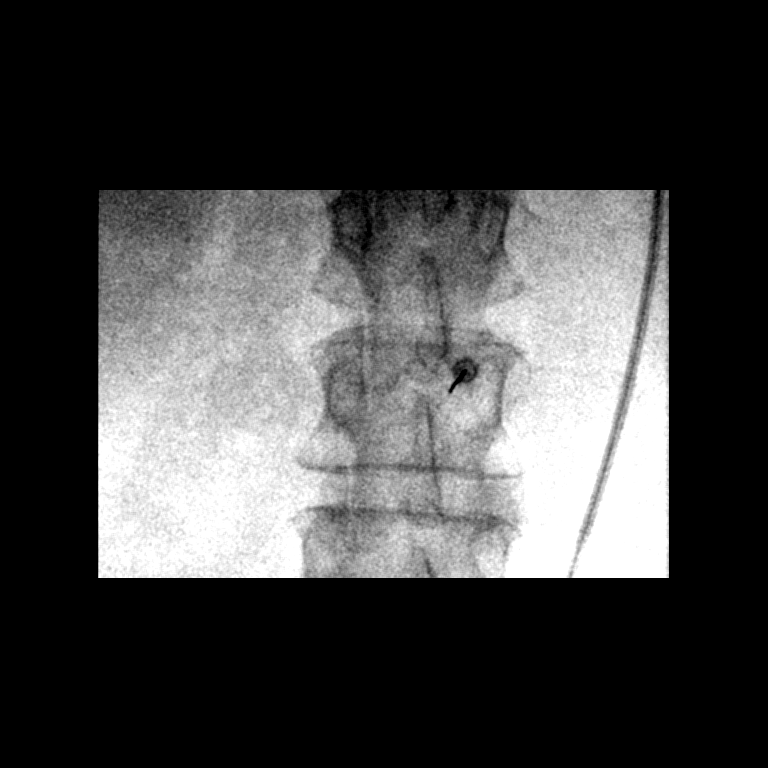

[3 of 3 positions shown; findings below may reference images not displayed]

EXAM:
DIAGNOSTIC LUMBAR PUNCTURE UNDER FLUOROSCOPIC GUIDANCE

FLUOROSCOPY TIME:  Fluoroscopy Time:  3.98 minutes

Radiation Exposure Index (if provided by the fluoroscopic device):
92 mGy

Number of Acquired Spot Images: 3

PROCEDURE:
Informed consent was obtained from the patient prior to the
procedure, including potential complications of headache, allergy,
and pain. With the patient prone, the lower back was prepped with
Betadine. 1% Lidocaine was used for local anesthesia. Lumbar
puncture was performed at the L3-L4 level using a 22 gauge needle.
No CSF was obtained despite multiple attempts. Additional anesthesia
was placed at the L2-L3 level and lumbar puncture was again
attempted under fluoroscopic visualization. Difficult lumbar
puncture, but eventually obtained clear colorless CSF with an
opening pressure of 19 mL (measured prone). 9 ml of CSF were
obtained for laboratory studies. The patient tolerated the procedure
well and there were no apparent complications.
IMPRESSION: Fluoroscopic guided lumbar puncture as above.
